# Patient Record
Sex: Female | Born: 1940
Health system: Southern US, Community
[De-identification: ages and names within clinical notes are randomized; demographics above are authoritative.]

## PROBLEM LIST (undated history)

## (undated) DIAGNOSIS — G40909 Epilepsy, unspecified, not intractable, without status epilepticus: Secondary | ICD-10-CM

## (undated) DIAGNOSIS — Z9889 Other specified postprocedural states: Secondary | ICD-10-CM

## (undated) DIAGNOSIS — K5790 Diverticulosis of intestine, part unspecified, without perforation or abscess without bleeding: Secondary | ICD-10-CM

## (undated) DIAGNOSIS — R011 Cardiac murmur, unspecified: Secondary | ICD-10-CM

## (undated) DIAGNOSIS — O99350 Diseases of the nervous system complicating pregnancy, unspecified trimester: Secondary | ICD-10-CM

## (undated) DIAGNOSIS — K8020 Calculus of gallbladder without cholecystitis without obstruction: Secondary | ICD-10-CM

## (undated) DIAGNOSIS — K219 Gastro-esophageal reflux disease without esophagitis: Secondary | ICD-10-CM

## (undated) DIAGNOSIS — R112 Nausea with vomiting, unspecified: Secondary | ICD-10-CM

## (undated) DIAGNOSIS — R7301 Impaired fasting glucose: Secondary | ICD-10-CM

## (undated) DIAGNOSIS — M8580 Other specified disorders of bone density and structure, unspecified site: Secondary | ICD-10-CM

## (undated) DIAGNOSIS — I10 Essential (primary) hypertension: Secondary | ICD-10-CM

## (undated) HISTORY — DX: Epilepsy, unspecified, not intractable, without status epilepticus: G40.909

## (undated) HISTORY — PX: CATARACT EXTRACTION, BILATERAL: SHX1313

## (undated) HISTORY — DX: Diseases of the nervous system complicating pregnancy, unspecified trimester: O99.350

## (undated) HISTORY — DX: Calculus of gallbladder without cholecystitis without obstruction: K80.20

## (undated) HISTORY — PX: FOOT SURGERY: SHX648

## (undated) HISTORY — DX: Essential (primary) hypertension: I10

## (undated) HISTORY — DX: Other specified disorders of bone density and structure, unspecified site: M85.80

## (undated) HISTORY — DX: Impaired fasting glucose: R73.01

## (undated) HISTORY — DX: Cardiac murmur, unspecified: R01.1

## (undated) HISTORY — DX: Diverticulosis of intestine, part unspecified, without perforation or abscess without bleeding: K57.90

---

## 1960-05-07 HISTORY — PX: CHEST TUBE INSERTION: SHX231

## 1998-01-20 ENCOUNTER — Other Ambulatory Visit: Admission: RE | Admit: 1998-01-20 | Discharge: 1998-01-20 | Payer: Self-pay | Admitting: Gynecology

## 1998-09-01 ENCOUNTER — Ambulatory Visit (HOSPITAL_BASED_OUTPATIENT_CLINIC_OR_DEPARTMENT_OTHER): Admission: RE | Admit: 1998-09-01 | Discharge: 1998-09-01 | Payer: Self-pay | Admitting: Orthopedic Surgery

## 1999-05-08 HISTORY — PX: COLONOSCOPY: SHX174

## 1999-06-22 ENCOUNTER — Encounter: Payer: Self-pay | Admitting: Gynecology

## 1999-06-22 ENCOUNTER — Encounter: Admission: RE | Admit: 1999-06-22 | Discharge: 1999-06-22 | Payer: Self-pay | Admitting: Gynecology

## 2000-07-04 ENCOUNTER — Other Ambulatory Visit: Admission: RE | Admit: 2000-07-04 | Discharge: 2000-07-04 | Payer: Self-pay | Admitting: Gynecology

## 2001-08-07 ENCOUNTER — Other Ambulatory Visit: Admission: RE | Admit: 2001-08-07 | Discharge: 2001-08-07 | Payer: Self-pay | Admitting: Gynecology

## 2002-05-07 DIAGNOSIS — M8580 Other specified disorders of bone density and structure, unspecified site: Secondary | ICD-10-CM

## 2002-05-07 HISTORY — DX: Other specified disorders of bone density and structure, unspecified site: M85.80

## 2002-06-26 ENCOUNTER — Encounter: Payer: Self-pay | Admitting: Gynecology

## 2002-06-26 ENCOUNTER — Encounter: Admission: RE | Admit: 2002-06-26 | Discharge: 2002-06-26 | Payer: Self-pay | Admitting: Gynecology

## 2002-09-30 ENCOUNTER — Other Ambulatory Visit: Admission: RE | Admit: 2002-09-30 | Discharge: 2002-09-30 | Payer: Self-pay | Admitting: Gynecology

## 2003-08-28 ENCOUNTER — Emergency Department (HOSPITAL_COMMUNITY): Admission: EM | Admit: 2003-08-28 | Discharge: 2003-08-28 | Payer: Self-pay | Admitting: Emergency Medicine

## 2004-05-07 HISTORY — PX: COLONOSCOPY: SHX174

## 2004-06-19 ENCOUNTER — Ambulatory Visit: Payer: Self-pay | Admitting: Internal Medicine

## 2004-06-26 ENCOUNTER — Other Ambulatory Visit: Admission: RE | Admit: 2004-06-26 | Discharge: 2004-06-26 | Payer: Self-pay | Admitting: Gynecology

## 2004-08-25 ENCOUNTER — Ambulatory Visit: Payer: Self-pay | Admitting: Internal Medicine

## 2004-09-08 ENCOUNTER — Ambulatory Visit: Payer: Self-pay | Admitting: Internal Medicine

## 2004-12-07 ENCOUNTER — Ambulatory Visit: Payer: Self-pay | Admitting: Internal Medicine

## 2005-01-29 ENCOUNTER — Ambulatory Visit: Payer: Self-pay | Admitting: Internal Medicine

## 2005-07-18 ENCOUNTER — Other Ambulatory Visit: Admission: RE | Admit: 2005-07-18 | Discharge: 2005-07-18 | Payer: Self-pay | Admitting: Gynecology

## 2006-08-08 ENCOUNTER — Ambulatory Visit: Payer: Self-pay | Admitting: Internal Medicine

## 2006-08-09 ENCOUNTER — Ambulatory Visit: Payer: Self-pay | Admitting: Internal Medicine

## 2006-08-12 ENCOUNTER — Encounter: Payer: Self-pay | Admitting: Internal Medicine

## 2006-08-15 ENCOUNTER — Ambulatory Visit: Payer: Self-pay | Admitting: Internal Medicine

## 2006-08-15 LAB — CONVERTED CEMR LAB
Creatinine,U: 75.7 mg/dL
Hgb A1c MFr Bld: 5.6 % (ref 4.6–6.0)
Microalb Creat Ratio: 17.2 mg/g (ref 0.0–30.0)
Microalb, Ur: 1.3 mg/dL (ref 0.0–1.9)

## 2006-08-20 ENCOUNTER — Encounter: Payer: Self-pay | Admitting: Internal Medicine

## 2006-09-04 DIAGNOSIS — M899 Disorder of bone, unspecified: Secondary | ICD-10-CM | POA: Insufficient documentation

## 2006-09-04 DIAGNOSIS — I10 Essential (primary) hypertension: Secondary | ICD-10-CM | POA: Insufficient documentation

## 2006-09-04 DIAGNOSIS — M949 Disorder of cartilage, unspecified: Secondary | ICD-10-CM

## 2006-09-05 ENCOUNTER — Ambulatory Visit: Payer: Self-pay | Admitting: Internal Medicine

## 2007-08-14 ENCOUNTER — Encounter: Payer: Self-pay | Admitting: Internal Medicine

## 2007-08-21 ENCOUNTER — Ambulatory Visit: Payer: Self-pay | Admitting: Internal Medicine

## 2007-08-21 DIAGNOSIS — R7989 Other specified abnormal findings of blood chemistry: Secondary | ICD-10-CM | POA: Insufficient documentation

## 2007-08-21 DIAGNOSIS — E785 Hyperlipidemia, unspecified: Secondary | ICD-10-CM | POA: Insufficient documentation

## 2007-08-21 LAB — CONVERTED CEMR LAB
Cholesterol, target level: 200 mg/dL
HDL goal, serum: 40 mg/dL
LDL Goal: 130 mg/dL

## 2008-04-02 ENCOUNTER — Ambulatory Visit: Payer: Self-pay | Admitting: Internal Medicine

## 2008-04-02 DIAGNOSIS — M5412 Radiculopathy, cervical region: Secondary | ICD-10-CM | POA: Insufficient documentation

## 2008-04-27 ENCOUNTER — Telehealth: Payer: Self-pay | Admitting: Internal Medicine

## 2009-05-07 DIAGNOSIS — R7301 Impaired fasting glucose: Secondary | ICD-10-CM

## 2009-05-07 HISTORY — DX: Impaired fasting glucose: R73.01

## 2009-06-08 ENCOUNTER — Telehealth (INDEPENDENT_AMBULATORY_CARE_PROVIDER_SITE_OTHER): Payer: Self-pay | Admitting: *Deleted

## 2009-08-19 ENCOUNTER — Ambulatory Visit: Payer: Self-pay | Admitting: Internal Medicine

## 2009-08-19 DIAGNOSIS — K5730 Diverticulosis of large intestine without perforation or abscess without bleeding: Secondary | ICD-10-CM | POA: Insufficient documentation

## 2009-08-19 DIAGNOSIS — IMO0002 Reserved for concepts with insufficient information to code with codable children: Secondary | ICD-10-CM | POA: Insufficient documentation

## 2010-05-28 ENCOUNTER — Encounter: Payer: Self-pay | Admitting: Internal Medicine

## 2010-06-04 LAB — CONVERTED CEMR LAB
ALT: 16 units/L (ref 0–35)
ALT: 18 units/L (ref 0–40)
AST: 19 units/L (ref 0–37)
AST: 20 units/L (ref 0–37)
Albumin: 4.2 g/dL (ref 3.5–5.2)
Alkaline Phosphatase: 63 units/L (ref 39–117)
BUN: 14 mg/dL (ref 6–23)
BUN: 17 mg/dL (ref 6–23)
Basophils Absolute: 0 10*3/uL (ref 0.0–0.1)
Basophils Relative: 0.3 % (ref 0.0–3.0)
Bilirubin, Direct: 0.1 mg/dL (ref 0.0–0.3)
CO2: 28 meq/L (ref 19–32)
Calcium: 9.3 mg/dL (ref 8.4–10.5)
Chloride: 101 meq/L (ref 96–112)
Cholesterol: 204 mg/dL — ABNORMAL HIGH (ref 0–200)
Creatinine, Ser: 0.8 mg/dL (ref 0.4–1.2)
Creatinine, Ser: 0.8 mg/dL (ref 0.4–1.2)
Direct LDL: 133 mg/dL
Eosinophils Absolute: 0 10*3/uL (ref 0.0–0.7)
Eosinophils Relative: 0.5 % (ref 0.0–5.0)
GFR calc non Af Amer: 75.32 mL/min (ref >60)
Glucose, Bld: 107 mg/dL — ABNORMAL HIGH (ref 70–99)
Glucose, Bld: 112 mg/dL — ABNORMAL HIGH (ref 70–99)
HCT: 43.9 % (ref 36.0–46.0)
HDL: 47.1 mg/dL (ref >39.00)
Hemoglobin: 14.9 g/dL (ref 12.0–15.0)
Hgb A1c MFr Bld: 6 % (ref 4.6–6.5)
Lymphocytes Relative: 18.1 % (ref 12.0–46.0)
Lymphs Abs: 1.6 10*3/uL (ref 0.7–4.0)
MCHC: 33.9 g/dL (ref 30.0–36.0)
MCV: 87.7 fL (ref 78.0–100.0)
Monocytes Absolute: 0.6 10*3/uL (ref 0.1–1.0)
Monocytes Relative: 7.1 % (ref 3.0–12.0)
Neutro Abs: 6.4 10*3/uL (ref 1.4–7.7)
Neutrophils Relative %: 74 % (ref 43.0–77.0)
Platelets: 306 10*3/uL (ref 150.0–400.0)
Potassium: 4.1 meq/L (ref 3.5–5.1)
Potassium: 4.3 meq/L (ref 3.5–5.1)
RBC: 5.01 M/uL (ref 3.87–5.11)
RDW: 13.7 % (ref 11.5–14.6)
Sodium: 139 meq/L (ref 135–145)
TSH: 1.41 microintl units/mL (ref 0.35–5.50)
Total Bilirubin: 0.6 mg/dL (ref 0.3–1.2)
Total CHOL/HDL Ratio: 4
Total Protein: 7.1 g/dL (ref 6.0–8.3)
Triglycerides: 144 mg/dL (ref 0.0–149.0)
VLDL: 28.8 mg/dL (ref 0.0–40.0)
Vit D, 25-Hydroxy: 49 ng/mL (ref 30–89)
WBC: 8.6 10*3/uL (ref 4.5–10.5)

## 2010-06-06 NOTE — Progress Notes (Signed)
Summary: Medication Concerns  Phone Note Call from Patient Call back at Home Phone [phone]   Caller: Patient Summary of Call: Message left on VM: Patient with RX concerns, please caa    Spoke with patient: Refill Lotrel, [facility], patient said she used [facility] in the past but will no longer be using them. Patient was informed her last OV 2009, she is due for an OV, patient said she will call back to schedule./[facility]  June 08, 2009 5:13 PM     Prescriptions: AMLODIPINE BESY-BENAZEPRIL HCL 5-20 MG  CAPS (AMLODIPINE BESY-BENAZEPRIL HCL) 1 by mouth once daily  #30 x 0   Entered by:   Shonna Chock   Authorized by:   Marga Melnick MD   Signed by:   Shonna Chock on 06/08/2009   Method used:   Electronically to        [facility] (retail)       [address]       [place], [place]  [address]       Ph: [phone]       Fax: [phone]   RxID:   [id number]

## 2010-06-06 NOTE — Assessment & Plan Note (Signed)
Summary: emp//pt will be fasting//lh   Vital Signs:  Patient profile:   70 year old female Height:      63.5 inches Weight:      121 pounds BMI:     21.17 Temp:     97.8 degrees F oral Pulse rate:   84 / minute Resp:     16 per minute BP sitting:   102 / 76  (left arm) Cuff size:   regular  Vitals Entered By: Shonna Chock (August 19, 2009 8:24 AM)  CC: 1.) Yearly follow-up and fasting labs 2.)Nerve pain, General Medical Evaluation Comments REVIEWED MED LIST, PATIENT AGREED DOSE AND INSTRUCTION CORRECT    CC:  1.) Yearly follow-up and fasting labs 2.)Nerve pain and General Medical Evaluation.  History of Present Illness: Teresa Morris is here for a physical; she is having a sciatic flare after yard work week of 04/04 & using [employer] @ gym. Preventive healthcare measures reviewed ;all up to date except no influenza immunization for years.  Preventive Screening-Counseling & Management  Alcohol-Tobacco     Smoking Status: quit  Caffeine-Diet-Exercise     Does Patient Exercise: yes  Allergies (verified): No Known Drug Allergies  Past History:  Past Medical History: Hyperlipidemia Hypertension Osteopenia,BMD @ Dr Vickey Sages' eclampsia/seizures with 1st pregnancy elevated lipids with normal C reactive protein &  homocysteine Diverticulosis, colon  Past Surgical History: collapsed lung due to  spontaneous pneumothorax 1962 two C-sections pneumonia as a child gravida  2 para 2 foot surgery  colonoscopy negative  2001 colonoscopy tics 2006 Cataract extraction,bilaterally  Family History: mother: bladder cancer, diabetes father: diabetes maternal grandmother: htn, MI  @ age 82 son: thyroid disease sister: thyroid disease daughter: thyroid disease  Social History: Low fat diet Retired Former Smoker: smoked 5 years total Alcohol use-no Regular exercise-yes: treadmill , stationary bike 3X /week Smoking Status:  quit Does Patient Exercise:  yes  Review of  Systems General:  Denies chills, fatigue, fever, sleep disorder, and sweats. Eyes:  Denies blurring, double vision, and vision loss-both eyes. ENT:  Denies difficulty swallowing and hoarseness. CV:  Denies chest pain or discomfort, leg cramps with exertion, palpitations, shortness of breath with exertion, swelling of feet, and swelling of hands. Resp:  Denies cough, shortness of breath, sputum productive, and wheezing. GI:  Denies abdominal pain, bloody stools, change in bowel habits, dark tarry stools, and indigestion. GU:  Denies discharge, dysuria, hematuria, and incontinence. MS:  Complains of low back pain; denies joint redness, joint swelling, loss of strength, and thoracic pain. Derm:  Denies changes in nail beds, dryness, hair loss, lesion(s), poor wound healing, and rash. Neuro:  Denies brief paralysis, numbness, and weakness; Tingling & sharp pain LLE laterally to knee in L3-4 area. Psych:  Denies anxiety and depression. Endo:  Denies cold intolerance, excessive hunger, excessive thirst, excessive urination, and heat intolerance. Heme:  Denies abnormal bruising and bleeding. Allergy:  Denies itching eyes and sneezing.  Physical Exam  General:  Appears younger than age; in some discomfort but in no acute distress; alert,appropriate and cooperative throughout examination Head:  Normocephalic and atraumatic without obvious abnormalities.  Eyes:  No corneal or conjunctival inflammation noted. No lid lag. Perrla. Funduscopic exam benign, without hemorrhages, exudates or papilledema.Slight ptosis OS. Ears:  External ear exam shows no significant lesions or deformities.  Otoscopic examination reveals clear canals, tympanic membranes are intact bilaterally without bulging, retraction, inflammation or discharge. Hearing is grossly normal bilaterally. Nose:  External nasal examination shows no deformity or  inflammation. Nasal mucosa are pink and moist without lesions or exudates. Mouth:  Oral  mucosa and oropharynx without lesions or exudates.  Teeth in good repair. Neck:  No deformities, masses, or tenderness noted.Minor thyroid asymmetry Lungs:  Normal respiratory effort, chest expands symmetrically. Lungs are clear to auscultation, no crackles or wheezes. Heart:  normal rate, regular rhythm, no gallop, no rub, no JVD, no HJR, and grade1/2-1  /6 systolic murmur.   Abdomen:  Bowel sounds positive,abdomen soft and non-tender without masses, organomegaly or hernias noted. Genitalia:  Dr. Vickey Sages, Gyn Msk:  No deformity or scoliosis noted of thoracic or lumbar spine.   Pulses:  R and L carotid,radial,dorsalis pedis and posterior tibial pulses are full and equal bilaterally Extremities:  No clubbing, cyanosis, edema. Pain L buttock with SLR maneuver. Crepitus L knee. Neurologic:  alert & oriented X3, strength normal in all extremities, heel /toe gait normal but painful subjectively,  DTRs symmetrical and normal.   Skin:  Intact without suspicious lesions or rashes Cervical Nodes:  No lymphadenopathy noted Axillary Nodes:  No palpable lymphadenopathy Psych:  memory intact for recent and remote, normally interactive, and good eye contact.     Impression & Recommendations:  Problem # 1:  PREVENTIVE HEALTH CARE (ICD-V70.0)  Orders: Venipuncture (18841) TLB-Lipid Panel (80061-LIPID) TLB-BMP (Basic Metabolic Panel-BMET) (80048-METABOL) TLB-CBC Platelet - w/Differential (85025-CBCD) TLB-Hepatic/Liver Function Pnl (80076-HEPATIC) TLB-TSH (Thyroid Stimulating Hormone) (84443-TSH) EKG w/ Interpretation (93000) T-Vitamin D (25-Hydroxy) (66063-01601)  Problem # 2:  LUMBAR RADICULOPATHY, LEFT (ICD-724.4) L3-4 radiation The following medications were removed from the medication list:    Tramadol Hcl 50 Mg Tabs (Tramadol hcl) .Marland Kitchen... 1 q 6 hrs prn Her updated medication list for this problem includes:    Cyclobenzaprine Hcl 5 Mg Tabs (Cyclobenzaprine hcl) .Marland Kitchen... 1 at bedtime prn     Cyclobenzaprine Hcl 5 Mg Tabs (Cyclobenzaprine hcl) .Marland Kitchen... 1-2 at bedtime as needed for back pain  Orders: Venipuncture (09323) EKG w/ Interpretation (93000)  Problem # 3:  HYPERLIPIDEMIA (ICD-272.4)  Orders: Venipuncture (55732) TLB-Lipid Panel (80061-LIPID) EKG w/ Interpretation (93000)  Problem # 4:  HYPERTENSION (ICD-401.9)  The following medications were removed from the medication list:    Amlodipine Besy-benazepril Hcl 5-20 Mg Caps (Amlodipine besy-benazepril hcl) .Marland Kitchen... 1 by mouth once daily Her updated medication list for this problem includes:    Amlodipine Besylate 5 Mg Tabs (Amlodipine besylate) .Marland Kitchen... 1 once daily    Benazepril Hcl 20 Mg Tabs (Benazepril hcl) .Marland Kitchen... 1 once daily  Orders: Venipuncture (20254) EKG w/ Interpretation (93000) Prescription Created Electronically [id number])  Problem # 5:  OSTEOPENIA (ICD-733.90)  Orders: Venipuncture (37628) EKG w/ Interpretation (93000) T-Vitamin D (25-Hydroxy) (31517-61607)  Complete Medication List: 1)  Multivitamins Tabs (Multiple vitamin) .Marland Kitchen.. 1 by mouth once daily 2)  Calcium 500 Mg Tabs (Calcium carbonate) .Marland Kitchen.. 1 by mouth once daily 3)  Cyclobenzaprine Hcl 5 Mg Tabs (Cyclobenzaprine hcl) .Marland Kitchen.. 1 at bedtime prn 4)  Amlodipine Besylate 5 Mg Tabs (Amlodipine besylate) .Marland Kitchen.. 1 once daily 5)  Benazepril Hcl 20 Mg Tabs (Benazepril hcl) .Marland Kitchen.. 1 once daily 6)  Gabapentin 100 Mg Caps (Gabapentin) .Marland Kitchen.. 1 every 8 hrs as needed for leg pain 7)  Cyclobenzaprine Hcl 5 Mg Tabs (Cyclobenzaprine hcl) .Marland Kitchen.. 1-2 at bedtime as needed for back pain  Patient Instructions: 1)  Share records with all MDs seen. Take samples of Celebrex 200 mg two times a day as needed (anti-inflammatory) Prescriptions: CYCLOBENZAPRINE HCL 5 MG TABS (CYCLOBENZAPRINE HCL) 1-2 at bedtime as needed for back  pain  #20 x 0   Entered and Authorized by:   Marga Melnick MD   Signed by:   Marga Melnick MD on 08/19/2009   Method used:   Faxed to ...       [facility] (retail)       [address]       [place], [place]  [address]       Ph: [phone]       Fax: [phone]   RxID:   [id number] GABAPENTIN 100 MG CAPS (GABAPENTIN) 1 every 8 hrs as needed for leg pain  #30 x 1   Entered and Authorized by:   Marga Melnick MD   Signed by:   Marga Melnick MD on 08/19/2009   Method used:   Faxed to ...       [facility] (retail)       [address]       [place], [place]  [address]       Ph: [phone]       Fax: [phone]   RxID:   [id number] BENAZEPRIL HCL 20 MG TABS (BENAZEPRIL HCL) 1 once daily  #90 x 3   Entered and Authorized by:   Marga Melnick MD   Signed by:   Marga Melnick MD on 08/19/2009   Method used:   Faxed to ...       [facility] (retail)       [address]       [place], [place]  [address]       Ph: [phone]       Fax: [phone]   RxID:   [id number] AMLODIPINE BESYLATE 5 MG TABS (AMLODIPINE BESYLATE) 1 once daily  #90 x 3   Entered and Authorized by:   Marga Melnick MD   Signed by:   Marga Melnick MD on 08/19/2009   Method used:   Faxed to ...       [facility] (retail)       [address]       [place], [place]  [address]       Ph: [phone]       Fax: [phone]   RxID:   [id number]

## 2010-08-24 ENCOUNTER — Other Ambulatory Visit: Payer: Self-pay | Admitting: Internal Medicine

## 2010-09-22 NOTE — Assessment & Plan Note (Signed)
[facility]                        [place] OFFICE NOTE   NAME:COVERTAlan, Riles                   MRN:          [id number]  DATE:08/09/2006                            DOB:          05-Jul-1940    Teresa Morris was seen August 09, 2006 for a comprehensive evaluation and  evaluation of risk factors.   She is essentially asymptomatic.   There is no changes in her past medical history.  She had a spontaneous  pneumothorax in 1962.  She  is gravida 2 para 2; two C-sections.  She  also had pneumonia as a child.  She has had outpatient foot surgery in  1999.  She has had colonoscopies in 2001 and 2006, diverticulosis was  found.   She did have eclampsia and seizures with a pregnancy.  Medical problems  include hypertension and dyslipidemia.  She also has osteopenia.   Her mother had bladder cancer and received radiation; she also was  diabetic.  Her father was diabetic.  In paternal grandmother had  hypertension and myocardial infarction late in life.  Thyroid disease is  found in her son, sister, and daughter.  She smoked less than a pack per  day for less than 3 years, she does not drink.  She exercises 2-3 times  per week with no cardiopulmonary symptoms.  She is restricting fat,  fried food.   She is on generic Lotrel 5/20.  SHE HAS NO KNOWN DRUG ALLERGIES.   REVIEW OF SYSTEMS:  Was completed en toto and was negative.   Specifically, in view of the family history, there was no history of  polyphagia, polydipsia, polyuria, paresthesias, nonhealing skin lesions.  She had an eye exam in February with no diabetic changes.   She is 5 foot 3 and weighs 128 pounds; this is up 3 pounds.  Pulse is 60  and irregular, respiratory rate 12, and blood pressure 120/82.  The fundal exam does reveal some arterial narrowing.  There is no lid  lag.  Otolaryngologic exam is unremarkable.  Dental hygiene is  excellent.  Thyroid is normal to palpation.  She  has no carotid bruits;  She does  have a grade 1 systolic murmur across the precordium.  CHEST:  Clear to auscultation.  She has no organomegaly or masses.  There was no aortic aneurysm.  Pedal pulses are intact, there is no edema.  NEUROPSYCHIATRIC:  Normal.  BREASTS, PELVIC, RECTAL:  Exams are deferred to Dr. Nicholas Lose, her  gynecologist.  She did have labs done prior to this visit.  Her glucose was mildly  elevated at 107.  In reviewing the chart she had a similar mild  hyperglycemia in 2006.  Serially, her LDL is in the area of 160.  I will  recommend to Teresa Chapel that she have an NMR fasting profile to determine her  cardiovascular risk and to assess genetic components to the  dyslipidemia.   Her blood pressure is well controlled and no change will be made in her  meds.  EKG was normal.  Her colonoscopic surveillance is up to date.  Dr. Nicholas Lose is performing bone densities and  these should be done every 25  months at his office; 1500 mg of calcium and at least 1000 mg of Vitamin  D would be recommended.   Labs from Dr. Johnn Hai office were reviewed; her LDL was 132 in 2007,  triglycerides were 171.  Her vitamin D level was low at 39.7.  Her last  bone density was November 01, 2004 and showed some progression of  osteopenia.  She would be due for repeat in August of 2008.  Dr. Nicholas Lose  apparently has recommended Boniva, but this was not initiated.     Titus Dubin. Alwyn Ren, MD,FACP,FCCP  Electronically Signed    [facility]  DD: 08/09/2006  DT: 08/09/2006  Job #: [id number]

## 2010-10-05 ENCOUNTER — Ambulatory Visit (INDEPENDENT_AMBULATORY_CARE_PROVIDER_SITE_OTHER): Payer: Medicare Other | Admitting: Internal Medicine

## 2010-10-05 ENCOUNTER — Encounter: Payer: Self-pay | Admitting: Internal Medicine

## 2010-10-05 VITALS — BP 112/68 | HR 72 | Temp 98.2°F | Resp 12 | Ht 62.25 in | Wt 116.6 lb

## 2010-10-05 DIAGNOSIS — K5730 Diverticulosis of large intestine without perforation or abscess without bleeding: Secondary | ICD-10-CM

## 2010-10-05 DIAGNOSIS — M949 Disorder of cartilage, unspecified: Secondary | ICD-10-CM

## 2010-10-05 DIAGNOSIS — M899 Disorder of bone, unspecified: Secondary | ICD-10-CM

## 2010-10-05 DIAGNOSIS — E785 Hyperlipidemia, unspecified: Secondary | ICD-10-CM

## 2010-10-05 DIAGNOSIS — M5412 Radiculopathy, cervical region: Secondary | ICD-10-CM

## 2010-10-05 DIAGNOSIS — I10 Essential (primary) hypertension: Secondary | ICD-10-CM

## 2010-10-05 DIAGNOSIS — Z0000 Encounter for general adult medical examination without abnormal findings: Secondary | ICD-10-CM

## 2010-10-05 MED ORDER — AMLODIPINE BESYLATE 5 MG PO TABS: 5.0000 mg | ORAL_TABLET | ORAL | Status: DC

## 2010-10-05 MED ORDER — BENAZEPRIL HCL 20 MG PO TABS: 20.0000 mg | ORAL_TABLET | ORAL | Status: DC

## 2010-10-05 NOTE — Progress Notes (Signed)
Subjective:    Patient ID: Teresa Morris, female    DOB: [birth year], 70 y.o.   MRN: [id number]  HPI Medicare Wellness Visit:  The following psychosocial & medical history were reviewed as required by Medicare.   Social history: caffeine: 2 cups coffee & 2 teas / day , alcohol:  none ,  tobacco use : quit 1962  & exercise : Cardio (treadmill, bike) 2-3 X/week.   Home & personal  safety / fall risk: no issues, activities of daily living: no  limitations , seatbelt use : yes , and smoke alarm employment : yes .  Power of Attorney/Living Will status :  In place  Vision ( as recorded per Nurse) & Hearing  evaluation :  Whisper heard @ 6 ft. Orientation :oriented X3 , memory & recall : normal, spelling  testing: normal,and mood & affect : normal . Depression / anxiety: denied Travel history : [place] , immunization status :Flu & Pneumovax not taken , transfusion history:  Post delivery by C section, and preventive health surveillance ( colonoscopies, BMD , etc as per protocol/ SOC): BMD due, Dental care:  Seen every 6 mos . Chart reviewed &  Updated. Active issues reviewed & addressed.        Review of Systems Patient reports no vision/ hearing  changes, adenopathy,fever, weight change,  persistant / recurrent hoarseness , swallowing issues, chest pain,palpitations,edema,persistant /recurrent cough, hemoptysis, dyspnea( rest/ exertional/paroxysmal nocturnal), gastrointestinal bleeding(melena, rectal bleeding), abdominal pain, significant heartburn,  bowel changes,GU symptoms(dysuria, hematuria,pyuria, incontinence) ), Gyn symptoms(abnormal  bleeding , pain),  syncope, focal weakness, memory loss,numbness & tingling, skin/hair /nail changes,abnormal bruising or bleeding, anxiety,or depression.      Objective:   Physical Exam Gen.: Thin but healthy and well-nourished in appearance. Alert, appropriate and cooperative throughout exam. Head: Normocephalic without obvious abnormalities. Eyes:  No corneal or conjunctival inflammation noted. Pupils equal round reactive to light and accommodation. Fundal exam is benign without hemorrhages, exudate, papilledema. Extraocular motion intact.  Ears: External  ear exam reveals no significant lesions or deformities. Canals clear .TMs normal. Hearing is grossly normal bilaterally. Nose: External nasal exam reveals no deformity or inflammation. Nasal mucosa are pink and moist. No lesions or exudates noted. Septum   normal  Mouth: Oral mucosa and oropharynx reveal no lesions or exudates. Teeth in good repair. Neck: No deformities, masses, or tenderness noted. Range of motion & . Thyroid normal. Lungs: Normal respiratory effort; chest expands symmetrically. Lungs are clear to auscultation without rales, wheezes, or increased work of breathing. Heart: Normal rate and rhythm. Normal S1 and S2. No gallop, click, or rub. No  murmur. Abdomen: Bowel sounds normal; abdomen soft and nontender. No masses, organomegaly or hernias noted. Genitalia: Dr Vickey Sages, [facility] , due 01/2011.                                                                                                                            Musculoskeletal/extremities: No deformity or scoliosis  noted of  the thoracic or lumbar spine. No clubbing, cyanosis, edema, or deformity noted. Range of motion  normal .Tone & strength  normal.Joints normal. Nail health  good. Vascular: Carotid, radial artery, dorsalis pedis and dorsalis posterior tibial pulses are full and equal. No bruits present. Neurologic: Alert and oriented x3. Deep tendon reflexes symmetrical and normal.                                                                                      Skin: Intact without suspicious lesions or rashes. Lymph: No cervical, axillary, or inguinal lymphadenopathy present. Psych: Mood and affect are normal. Normally interactive                                                                                          Assessment & Plan:   #1Medicare wellness visit; criteria met & data enter  #2 hypertension, controlled  #3 dyslipidemia, LDL goal is less than 100 based on advanced cholesterol testing. No family history of premature coronary disease/MI  #4 osteopenia, bone mineral density as per gynecology. Vitamin D level recheck her fasting labs.

## 2010-10-05 NOTE — Patient Instructions (Signed)
Preventive Health Care: Exercise  30-45  minutes a day, 3-4 days a week. Walking is especially valuable in preventing Osteoporosis. Eat a low-fat diet with lots of fruits and vegetables, up to 7-9 servings per day. Avoid obesity; your goal = waist less than 35 inches.Consume less than 30 grams of sugar per day from foods & drinks with High Fructose Corn Syrup as #2,3 or #4 on label. Please schedule fasting labs: BMET, fasting lipids, CBC and differential, TSH, hepatic panel and vitamin D level( 401.9, 272.4, 733.90)

## 2010-10-05 NOTE — Assessment & Plan Note (Signed)
Resolved w/o surgery

## 2010-10-08 ENCOUNTER — Encounter: Payer: Self-pay | Admitting: Internal Medicine

## 2010-10-08 NOTE — Progress Notes (Signed)
  Subjective:    Patient ID: Teresa Morris, female    DOB: 07/03/1940, 70 y.o.   MRN: [id number]  HPI    Review of Systems     Objective:   Physical Exam        Assessment & Plan:

## 2010-10-10 ENCOUNTER — Other Ambulatory Visit: Payer: Self-pay | Admitting: *Deleted

## 2010-10-10 DIAGNOSIS — M949 Disorder of cartilage, unspecified: Secondary | ICD-10-CM

## 2010-10-10 DIAGNOSIS — M899 Disorder of bone, unspecified: Secondary | ICD-10-CM

## 2010-10-10 DIAGNOSIS — I10 Essential (primary) hypertension: Secondary | ICD-10-CM

## 2010-10-10 DIAGNOSIS — E785 Hyperlipidemia, unspecified: Secondary | ICD-10-CM

## 2010-10-11 ENCOUNTER — Other Ambulatory Visit (INDEPENDENT_AMBULATORY_CARE_PROVIDER_SITE_OTHER): Payer: Medicare Other

## 2010-10-11 DIAGNOSIS — I10 Essential (primary) hypertension: Secondary | ICD-10-CM

## 2010-10-11 DIAGNOSIS — M949 Disorder of cartilage, unspecified: Secondary | ICD-10-CM

## 2010-10-11 DIAGNOSIS — M899 Disorder of bone, unspecified: Secondary | ICD-10-CM

## 2010-10-11 DIAGNOSIS — E785 Hyperlipidemia, unspecified: Secondary | ICD-10-CM

## 2010-10-11 LAB — CBC WITH DIFFERENTIAL/PLATELET
Basophils Absolute: 0 10*3/uL (ref 0.0–0.1)
Eosinophils Absolute: 0.1 10*3/uL (ref 0.0–0.7)
Lymphocytes Relative: 24.9 % (ref 12.0–46.0)
MCHC: 33.6 g/dL (ref 30.0–36.0)
Monocytes Relative: 7.8 % (ref 3.0–12.0)
Neutrophils Relative %: 66 % (ref 43.0–77.0)
RBC: 5 Mil/uL (ref 3.87–5.11)
RDW: 13.9 % (ref 11.5–14.6)

## 2010-10-11 LAB — HEPATIC FUNCTION PANEL
ALT: 16 U/L (ref 0–35)
AST: 20 U/L (ref 0–37)
Albumin: 4.8 g/dL (ref 3.5–5.2)
Alkaline Phosphatase: 65 U/L (ref 39–117)
Bilirubin, Direct: 0.1 mg/dL (ref 0.0–0.3)
Total Bilirubin: 0.7 mg/dL (ref 0.3–1.2)
Total Protein: 7.4 g/dL (ref 6.0–8.3)

## 2010-10-11 LAB — LIPID PANEL
Cholesterol: 230 mg/dL — ABNORMAL HIGH (ref 0–200)
HDL: 53.7 mg/dL (ref >39.00)
Total CHOL/HDL Ratio: 4
VLDL: 21.4 mg/dL (ref 0.0–40.0)

## 2010-10-11 LAB — VITAMIN D 25 HYDROXY (VIT D DEFICIENCY, FRACTURES): Vit D, 25-Hydroxy: 50 ng/mL (ref 30–89)

## 2010-10-11 LAB — BASIC METABOLIC PANEL
GFR: 70 mL/min (ref >60.00)
Glucose, Bld: 104 mg/dL — ABNORMAL HIGH (ref 70–99)
Potassium: 5.4 mEq/L — ABNORMAL HIGH (ref 3.5–5.1)
Sodium: 140 mEq/L (ref 135–145)

## 2010-10-11 NOTE — Progress Notes (Signed)
Labs only

## 2010-11-16 ENCOUNTER — Encounter: Payer: Self-pay | Admitting: Internal Medicine

## 2011-02-19 ENCOUNTER — Ambulatory Visit: Payer: Medicare Other | Admitting: Internal Medicine

## 2011-10-29 ENCOUNTER — Telehealth: Payer: Self-pay | Admitting: Internal Medicine

## 2011-10-29 DIAGNOSIS — I10 Essential (primary) hypertension: Secondary | ICD-10-CM

## 2011-10-29 MED ORDER — AMLODIPINE BESYLATE 5 MG PO TABS: 5.0000 mg | ORAL_TABLET | Freq: Every day | ORAL | Status: DC

## 2011-10-29 MED ORDER — BENAZEPRIL HCL 20 MG PO TABS: 20.0000 mg | ORAL_TABLET | Freq: Every day | ORAL | Status: DC

## 2011-10-29 NOTE — Telephone Encounter (Signed)
Rx sent 

## 2011-10-29 NOTE — Telephone Encounter (Signed)
The pt scheduled her cpe for late august.  She is hoping to get her blood pressure medicine refilled to last until then. She is hoping to have it sent to the [facility] on [address].  Thanks!

## 2011-12-04 ENCOUNTER — Encounter: Payer: Self-pay | Admitting: Internal Medicine

## 2011-12-24 ENCOUNTER — Ambulatory Visit (INDEPENDENT_AMBULATORY_CARE_PROVIDER_SITE_OTHER): Payer: Medicare Other | Admitting: Internal Medicine

## 2011-12-24 ENCOUNTER — Encounter: Payer: Self-pay | Admitting: Internal Medicine

## 2011-12-24 VITALS — BP 114/70 | HR 79 | Temp 97.9°F | Resp 12 | Ht 62.5 in | Wt 121.8 lb

## 2011-12-24 DIAGNOSIS — E785 Hyperlipidemia, unspecified: Secondary | ICD-10-CM

## 2011-12-24 DIAGNOSIS — K5730 Diverticulosis of large intestine without perforation or abscess without bleeding: Secondary | ICD-10-CM

## 2011-12-24 DIAGNOSIS — M899 Disorder of bone, unspecified: Secondary | ICD-10-CM

## 2011-12-24 DIAGNOSIS — M949 Disorder of cartilage, unspecified: Secondary | ICD-10-CM

## 2011-12-24 DIAGNOSIS — I10 Essential (primary) hypertension: Secondary | ICD-10-CM

## 2011-12-24 DIAGNOSIS — Z0000 Encounter for general adult medical examination without abnormal findings: Secondary | ICD-10-CM

## 2011-12-24 NOTE — Patient Instructions (Addendum)
Preventive Health Care: Exercise  30-45  minutes a day, 3-4 days a week. Walking is especially valuable in preventing Osteoporosis. Eat a low-fat diet with lots of fruits and vegetables, up to 7-9 servings per day.  Consume less than 30 grams of sugar per day from foods & drinks with High Fructose Corn Syrup as # 1,2,3 or #4 on label. Please  schedule fasting Labs : BMET,Lipids, hepatic panel, CBC & dif, TSH,vitamin D level.PLEASE BRING THESE INSTRUCTIONS TO FOLLOW UP  LAB APPOINTMENT.This will guarantee correct labs are drawn, eliminating need for repeat blood sampling ( needle sticks ! ). Diagnoses /Codes:733.90,401.9,272.4,562.10  If you activate My Chart; the results can be released to you as soon as they populate from the lab. If you choose not to use this program; the labs have to be reviewed, copied & mailed causing a delay in getting the results to you.

## 2011-12-24 NOTE — Progress Notes (Signed)
Subjective:    Patient ID: Teresa Morris, female    DOB: 1940-10-13, 71 y.o.   MRN: [id number]  HPI Medicare Wellness Visit:  The following psychosocial & medical history were reviewed as required by Medicare.   Social history: caffeine: 2 cups / day , alcohol:  no ,  tobacco use : quit 1962  & exercise : usually 2-3 X/ week with machines & treadmilll.   Home & personal  safety / fall risk: no issues, activities of daily living: no limitations , seatbelt use : yes , and smoke alarm employment : yes .  Power of Attorney/Living Will status : in place  Vision ( as recorded per Nurse) & Hearing  evaluation :  Ophth exam this year ; no hearing evaluation. Orientation :oriented x 3 , memory & recall :good,  math testing: good,and mood & affect : normal . Depression / anxiety: denied Travel history : [place] , immunization status :PNA needed , transfusion history: with pneumonia as child, and preventive health surveillance ( colonoscopies, BMD , etc as per protocol/ [facility]): colonoscopy up to date, Dental care:  Every 6 mos . Chart reviewed &  Updated. Active issues reviewed & addressed.       Review of Systems HYPERTENSION: Disease Monitoring: Blood pressure average - 120/70  hest pain, palpitations- no       Dyspnea- no Medications: Compliance- yes  Lightheadedness,Syncope- no   Edema- no  FASTING HYPERGLYCEMIA, PMH of:  Polyuria/phagia/dipsia- no      Visual problems- no  HYPERLIPIDEMIA: Disease Monitoring: See symptoms for Hypertension Medications: Compliance- diet improved since 2012  Abd pain, bowel changes- no  Muscle aches- no         Objective:   Physical Exam Gen.: Thin but healthy and well-nourished in appearance. Alert, appropriate and cooperative throughout exam.Appears younger than stated age  Head: Normocephalic without obvious abnormalities  Eyes: No corneal or conjunctival inflammation noted. Pupils equal round reactive to light and accommodation.  Vision grossly normal. Ears: External  ear exam reveals no significant lesions or deformities. Canals clear .TMs normal. Hearing is grossly normal bilaterally. Nose: External nasal exam reveals no deformity or inflammation. Nasal mucosa are pink and moist. No lesions or exudates noted.   Mouth: Oral mucosa and oropharynx reveal no lesions or exudates. Teeth in good repair. Neck: No deformities, masses, or tenderness noted. Range of motion & Thyroid normal Lungs: Normal respiratory effort; chest expands symmetrically. Lungs are clear to auscultation without rales, wheezes, or increased work of breathing. Heart: Normal rate and rhythm. Normal S1 and S2. No gallop, click, or rub. Grade 1.5 /6 systolic murmur  @ base Abdomen: Bowel sounds normal; abdomen soft and nontender. No masses, organomegaly or hernias noted. Genitalia: Dr Vickey Sages                                                     Musculoskeletal/extremities: No significant deformity or scoliosis noted of  the thoracic or lumbar spine. No clubbing, cyanosis, edema, or deformity noted. Range of motion  normal .Tone & strength  normal.Joints normal. Nail health  good. Vascular: Carotid, radial artery, dorsalis pedis and  posterior tibial pulses are full and equal. No bruits present. Neurologic: Alert and oriented x3. Deep tendon reflexes symmetrical and normal.          Skin: Intact  without suspicious lesions or rashes. Lymph: No cervical, axillary lymphadenopathy present. Psych: Mood and affect are normal. Normally interactive                                                                                         Assessment & Plan:  #1 Medicare Wellness Exam; criteria met ; data entered #2 Problem List reviewed ; Assessment/ Recommendations made Plan: see Orders

## 2012-01-10 ENCOUNTER — Other Ambulatory Visit (INDEPENDENT_AMBULATORY_CARE_PROVIDER_SITE_OTHER): Payer: Medicare Other

## 2012-01-10 DIAGNOSIS — I10 Essential (primary) hypertension: Secondary | ICD-10-CM

## 2012-01-10 DIAGNOSIS — E785 Hyperlipidemia, unspecified: Secondary | ICD-10-CM

## 2012-01-10 DIAGNOSIS — Z0000 Encounter for general adult medical examination without abnormal findings: Secondary | ICD-10-CM

## 2012-01-10 LAB — LIPID PANEL
Cholesterol: 180 mg/dL (ref 0–200)
Triglycerides: 106 mg/dL (ref 0.0–149.0)

## 2012-01-10 LAB — CBC WITH DIFFERENTIAL/PLATELET
Basophils Absolute: 0 10*3/uL (ref 0.0–0.1)
Eosinophils Absolute: 0.1 10*3/uL (ref 0.0–0.7)
Hemoglobin: 13.7 g/dL (ref 12.0–15.0)
Lymphocytes Relative: 26.4 % (ref 12.0–46.0)
MCHC: 32.9 g/dL (ref 30.0–36.0)
Neutro Abs: 3.7 10*3/uL (ref 1.4–7.7)
Neutrophils Relative %: 61.9 % (ref 43.0–77.0)
Platelets: 266 10*3/uL (ref 150.0–400.0)
RDW: 13.9 % (ref 11.5–14.6)

## 2012-01-10 LAB — HEPATIC FUNCTION PANEL
ALT: 14 U/L (ref 0–35)
AST: 19 U/L (ref 0–37)
Alkaline Phosphatase: 49 U/L (ref 39–117)
Bilirubin, Direct: 0.1 mg/dL (ref 0.0–0.3)
Total Protein: 6.6 g/dL (ref 6.0–8.3)

## 2012-01-10 LAB — TSH: TSH: 1.45 u[IU]/mL (ref 0.35–5.50)

## 2012-01-10 LAB — BASIC METABOLIC PANEL
BUN: 17 mg/dL (ref 6–23)
Creatinine, Ser: 0.8 mg/dL (ref 0.4–1.2)
GFR: 79.37 mL/min (ref >60.00)

## 2012-01-15 ENCOUNTER — Telehealth: Payer: Self-pay | Admitting: Internal Medicine

## 2012-01-15 NOTE — Telephone Encounter (Signed)
Patient was informed labs basically normal away from LDL @ 110 Range 0-99. Labs will be mailed once reviewed by MD. Patient stated is should be active with Mychart, In informed her that her MyChart status states pending, I gave patient Number ([phone]) to contact MyChart Help link.  Dr.Hopper please advise on patient's labs

## 2012-01-15 NOTE — Telephone Encounter (Signed)
please call with lab results or send to [facility] call after 2pm [phone]---lm on triage line 858am

## 2012-02-14 ENCOUNTER — Ambulatory Visit (INDEPENDENT_AMBULATORY_CARE_PROVIDER_SITE_OTHER): Payer: Medicare Other

## 2012-02-14 DIAGNOSIS — Z23 Encounter for immunization: Secondary | ICD-10-CM

## 2012-02-29 ENCOUNTER — Other Ambulatory Visit: Payer: Self-pay

## 2012-02-29 DIAGNOSIS — I10 Essential (primary) hypertension: Secondary | ICD-10-CM

## 2012-02-29 MED ORDER — BENAZEPRIL HCL 20 MG PO TABS: 20.0000 mg | ORAL_TABLET | Freq: Every day | ORAL | Status: DC

## 2012-02-29 MED ORDER — AMLODIPINE BESYLATE 5 MG PO TABS: 5.0000 mg | ORAL_TABLET | Freq: Every day | ORAL | Status: DC

## 2012-02-29 NOTE — Telephone Encounter (Signed)
Rx sent pt aware.   MW  

## 2012-06-02 ENCOUNTER — Other Ambulatory Visit: Payer: Self-pay | Admitting: Internal Medicine

## 2012-11-27 ENCOUNTER — Other Ambulatory Visit: Payer: Self-pay | Admitting: Internal Medicine

## 2012-12-05 ENCOUNTER — Encounter: Payer: Self-pay | Admitting: Internal Medicine

## 2013-01-12 ENCOUNTER — Encounter: Payer: Self-pay | Admitting: Internal Medicine

## 2013-01-12 ENCOUNTER — Ambulatory Visit (INDEPENDENT_AMBULATORY_CARE_PROVIDER_SITE_OTHER): Payer: Medicare Other | Admitting: Internal Medicine

## 2013-01-12 VITALS — BP 137/66 | HR 68 | Temp 98.4°F | Ht 62.75 in | Wt 116.0 lb

## 2013-01-12 DIAGNOSIS — M899 Disorder of bone, unspecified: Secondary | ICD-10-CM

## 2013-01-12 DIAGNOSIS — E785 Hyperlipidemia, unspecified: Secondary | ICD-10-CM

## 2013-01-12 DIAGNOSIS — K5730 Diverticulosis of large intestine without perforation or abscess without bleeding: Secondary | ICD-10-CM

## 2013-01-12 DIAGNOSIS — Z0000 Encounter for general adult medical examination without abnormal findings: Secondary | ICD-10-CM

## 2013-01-12 DIAGNOSIS — I10 Essential (primary) hypertension: Secondary | ICD-10-CM

## 2013-01-12 LAB — BASIC METABOLIC PANEL
BUN: 12 mg/dL (ref 6–23)
CO2: 29 mEq/L (ref 19–32)
Glucose, Bld: 93 mg/dL (ref 70–99)
Potassium: 4.2 mEq/L (ref 3.5–5.1)
Sodium: 140 mEq/L (ref 135–145)

## 2013-01-12 LAB — CBC WITH DIFFERENTIAL/PLATELET
Eosinophils Relative: 1.3 % (ref 0.0–5.0)
HCT: 45 % (ref 36.0–46.0)
Hemoglobin: 14.8 g/dL (ref 12.0–15.0)
Lymphs Abs: 2 10*3/uL (ref 0.7–4.0)
Monocytes Relative: 7.3 % (ref 3.0–12.0)
Neutro Abs: 6.1 10*3/uL (ref 1.4–7.7)
Platelets: 273 10*3/uL (ref 150.0–400.0)
WBC: 8.9 10*3/uL (ref 4.5–10.5)

## 2013-01-12 LAB — HEPATIC FUNCTION PANEL
ALT: 17 U/L (ref 0–35)
AST: 17 U/L (ref 0–37)
Total Bilirubin: 0.6 mg/dL (ref 0.3–1.2)

## 2013-01-12 LAB — LIPID PANEL
Cholesterol: 211 mg/dL — ABNORMAL HIGH (ref 0–200)
Total CHOL/HDL Ratio: 4

## 2013-01-12 MED ORDER — BENAZEPRIL HCL 20 MG PO TABS: ORAL_TABLET | ORAL | Status: DC

## 2013-01-12 MED ORDER — AMLODIPINE BESYLATE 5 MG PO TABS: ORAL_TABLET | ORAL | Status: DC

## 2013-01-12 NOTE — Progress Notes (Signed)
Subjective:    Patient ID: Teresa Morris, female    DOB: [birth year], 71 y.o.   MRN: [id number]  HPI Medicare Wellness Visit:  Psychosocial & medical history were reviewed as required by Medicare (abuse,antisocial behavioral risks,firearm risk).  Social history: caffeine: 2 cups coffee / day   & 2 teas/ day, alcohol:  no ,  tobacco use:  Quit 1962  Exercise :  See below Home & personal  safety / fall risk:no Limitations of activities of daily living:no Seatbelt  and smoke alarm use:yes Power of Attorney/Living Will status : in place Ophthalmology exam status :current Hearing evaluation status:not current Orientation :oriented X 3 Memory & recall :good Math testing: Active depression / anxiety:denied Foreign travel history : [place] 1998 Immunization status for Shingles /Flu/ PNA/ tetanus :flu needed Transfusion history:  ? With 1st C section 1958 Preventive health surveillance status of colonoscopy, BMD , mammograms,PAP as per protocol/ SOC: current Dental care:  Seen every 6 mos Chart reviewed &  Updated. Active issues reviewed & addressed.      Review of Systems She is on a heart healthy diet; she exercises as walking 2 miles 5 times per week without symptoms. Specifically she denies chest pain, palpitations, dyspnea, or claudication.  Family history is negative for premature coronary disease.  Advanced cholesterol testing reveals her LDL goal is less than 100. There is aversion to statin medication . BP @ home averages 135/70.     Objective:   Physical Exam Gen.: Thin but healthy and well-nourished in appearance. Alert, appropriate and cooperative throughout exam.Appears younger than stated age  Head: Normocephalic without obvious abnormalities Eyes: No corneal or conjunctival inflammation noted. Pupils equal round reactive to light and accommodation.  Extraocular motion intact. Vision grossly normal with intraocular lenses Ears: External  ear exam reveals no  significant lesions or deformities. Canals clear .TMs normal. Hearing is grossly normal bilaterally. Nose: External nasal exam reveals no deformity or inflammation. Nasal mucosa are pink and moist. No lesions or exudates noted.   Mouth: Oral mucosa and oropharynx reveal no lesions or exudates. Teeth in good repair. Neck: No deformities, masses, or tenderness noted. Range of motion & Thyroid normal. ? Cervical rib Lungs: Normal respiratory effort; chest expands symmetrically. Lungs are clear to auscultation without rales, wheezes, or increased work of breathing. Heart: Normal rate and rhythm. Normal S1 and S2. No gallop, click, or rub. Grade 1/6 systolic murmur. Abdomen: Bowel sounds normal; abdomen soft and nontender. No masses, organomegaly or hernias noted. Genitalia: As per Gyn                                  Musculoskeletal/extremities: Accentuated curvature of upper thoracic spine. No clubbing, cyanosis, edema, or significant extremity  deformity noted. Range of motion normal .Tone & strength  Normal. Joints normal . Nail health good. Able to lie down & sit up w/o help. Negative SLR bilaterally Vascular: Carotid, radial artery, dorsalis pedis and  posterior tibial pulses are full and equal. No bruits present. Neurologic: Alert and oriented x3. Deep tendon reflexes symmetrical and normal.         Skin: Intact without suspicious lesions or rashes. Lymph: No cervical, axillary lymphadenopathy present. Psych: Mood and affect are normal. Normally interactive  Assessment & Plan:  #1 Medicare Wellness Exam; criteria met ; data entered #2 Problem List/Diagnoses reviewed Plan:  Assessments made/ Orders entered  

## 2013-01-12 NOTE — Patient Instructions (Addendum)
Minimal Blood Pressure Goal= AVERAGE < 140/90;  Ideal is an AVERAGE < 135/85. This AVERAGE should be calculated from @ least 5-7 BP readings taken @ different times of day on different days of week. You should not respond to isolated BP readings , but rather the AVERAGE for that week .Please bring your  blood pressure cuff to office visits to verify that it is reliable.It  can also be checked against the blood pressure device at the pharmacy. Finger or wrist cuffs are not dependable; an arm cuff is.  If you activate the  My Chart system; lab & Xray results will be released directly  to you as soon as I review & address these through the computer. If you choose not to sign up for My Chart within 36 hours of labs being drawn; results will be reviewed & interpretation added before being copied & mailed, causing a delay in getting the results to you.If you do not receive that report within 7-10 days ,please call. Additionally you can use this system to gain direct  access to your records  if  out of town or @ an office of a  physician who is not in  the My Chart network.  This improves continuity of care & places you in control of your medical record.  

## 2013-01-15 LAB — VITAMIN D 1,25 DIHYDROXY
Vitamin D 1, 25 (OH)2 Total: 59 pg/mL (ref 18–72)
Vitamin D2 1, 25 (OH)2: <8 pg/mL
Vitamin D3 1, 25 (OH)2: 59 pg/mL

## 2013-02-11 ENCOUNTER — Ambulatory Visit (INDEPENDENT_AMBULATORY_CARE_PROVIDER_SITE_OTHER): Payer: Medicare Other

## 2013-02-11 DIAGNOSIS — Z23 Encounter for immunization: Secondary | ICD-10-CM

## 2013-12-07 ENCOUNTER — Encounter: Payer: Self-pay | Admitting: Internal Medicine

## 2014-01-26 ENCOUNTER — Other Ambulatory Visit (HOSPITAL_COMMUNITY): Payer: Self-pay | Admitting: Internal Medicine

## 2014-01-26 ENCOUNTER — Ambulatory Visit (HOSPITAL_COMMUNITY)
Admission: RE | Admit: 2014-01-26 | Discharge: 2014-01-26 | Disposition: A | Discharge status: Home / Self Care | Payer: Medicare Other | Source: Ambulatory Visit | Attending: Internal Medicine | Admitting: Internal Medicine

## 2014-01-26 DIAGNOSIS — R17 Unspecified jaundice: Secondary | ICD-10-CM

## 2014-01-26 DIAGNOSIS — K8020 Calculus of gallbladder without cholecystitis without obstruction: Secondary | ICD-10-CM | POA: Insufficient documentation

## 2014-01-26 DIAGNOSIS — R109 Unspecified abdominal pain: Secondary | ICD-10-CM | POA: Diagnosis present

## 2014-01-26 MED ORDER — IOHEXOL 300 MG/ML  SOLN
80.0000 mL | Freq: Once | INTRAMUSCULAR | Status: AC | PRN
  Administered 2014-01-26: 80 mL via INTRAVENOUS

## 2014-01-27 ENCOUNTER — Encounter: Payer: Self-pay | Admitting: *Deleted

## 2014-01-27 ENCOUNTER — Other Ambulatory Visit (HOSPITAL_COMMUNITY): Payer: Self-pay | Admitting: Internal Medicine

## 2014-01-27 ENCOUNTER — Telehealth: Payer: Self-pay | Admitting: Internal Medicine

## 2014-01-27 DIAGNOSIS — R748 Abnormal levels of other serum enzymes: Secondary | ICD-10-CM

## 2014-01-27 NOTE — Telephone Encounter (Signed)
Dr. Juanda Chance reviewed records.Scheduled on 01/28/14 at 1:30 pm.  Selena Batten aware.

## 2014-01-27 NOTE — Telephone Encounter (Signed)
Left a message for Kim to call back

## 2014-01-28 ENCOUNTER — Encounter (HOSPITAL_COMMUNITY): Payer: Self-pay

## 2014-01-28 ENCOUNTER — Encounter: Payer: Self-pay | Admitting: *Deleted

## 2014-01-28 ENCOUNTER — Ambulatory Visit (HOSPITAL_COMMUNITY): Admission: RE | Admit: 2014-01-28 | Payer: Medicare Other | Source: Ambulatory Visit

## 2014-01-28 ENCOUNTER — Telehealth: Payer: Self-pay

## 2014-01-28 ENCOUNTER — Other Ambulatory Visit (INDEPENDENT_AMBULATORY_CARE_PROVIDER_SITE_OTHER): Payer: Medicare Other

## 2014-01-28 ENCOUNTER — Other Ambulatory Visit: Payer: Self-pay

## 2014-01-28 ENCOUNTER — Ambulatory Visit (INDEPENDENT_AMBULATORY_CARE_PROVIDER_SITE_OTHER): Payer: Medicare Other | Admitting: Internal Medicine

## 2014-01-28 ENCOUNTER — Encounter (HOSPITAL_COMMUNITY): Payer: Self-pay | Admitting: *Deleted

## 2014-01-28 VITALS — BP 140/72 | HR 72 | Ht 61.0 in | Wt 109.5 lb

## 2014-01-28 DIAGNOSIS — R933 Abnormal findings on diagnostic imaging of other parts of digestive tract: Secondary | ICD-10-CM

## 2014-01-28 DIAGNOSIS — K831 Obstruction of bile duct: Secondary | ICD-10-CM

## 2014-01-28 DIAGNOSIS — R1013 Epigastric pain: Secondary | ICD-10-CM

## 2014-01-28 LAB — HEPATIC FUNCTION PANEL
ALK PHOS: 158 U/L — AB (ref 39–117)
ALT: 220 U/L — AB (ref 0–35)
AST: 106 U/L — ABNORMAL HIGH (ref 0–37)
Albumin: 3.9 g/dL (ref 3.5–5.2)
BILIRUBIN DIRECT: 2.8 mg/dL — AB (ref 0.0–0.3)
BILIRUBIN TOTAL: 4.6 mg/dL — AB (ref 0.2–1.2)
TOTAL PROTEIN: 7 g/dL (ref 6.0–8.3)

## 2014-01-28 MED ORDER — CIPROFLOXACIN HCL 500 MG PO TABS: 500.0000 mg | ORAL_TABLET | Freq: Two times a day (BID) | ORAL | Status: DC

## 2014-01-28 NOTE — Telephone Encounter (Signed)
ERCP scheduled, pt instructed and medications reviewed.  Patient instructions mailed to home.  Patient to call with any questions or concerns.  

## 2014-01-28 NOTE — Progress Notes (Signed)
Teresa Morris 1941-04-14 [id number]  Note: This dictation was prepared with Dragon digital system. Any transcriptional errors that result from this procedure are unintentional.   History of Present Illness:  This is a 73 year old white female referred by Dr. Waynard Edwards for abnormal LFTs. She gives hx of intermittent epigastric pain anteriorly for past 4 weeks  associated with nausea. The pain has been  mostly at night and postprandially. She is a former patient of Dr. Alwyn Ren. She started seeing Dr. Waynard Edwards last summer and had a complete physical exam in July 2015. On her routine lab work, her liver function tests were elevated. On September 23, her total bilirubin was 7.8 with direct bilirubin of 5.7, AST of 116 and ALT 336. Her alkaline phosphatase was 158. A CT scan of the abdomen which Dr. Waynard Edwards ordered on 01/27/2014, revealed  cholelithiasis , normal thickness  gallbladder without  sign of cholecystitis. Her common bile duct was normal size and there was no intrahepatic duct dilatation. There was no pancreatic mass on adenopathy. Patient was not aware of having gallstones. Her son had  gallbladder removed about 15 years ago. Pt had normal liver function tests in 2010, 2011, 2012 and 2013 as well as in September 2014. Additional medical problems include high blood pressure, hyperlipidemia and cervical and lumbar radiculopathy. We did a screening colonoscopy in May 2006 which  showed moderately severe diverticulosis of the left colon. Patient denies  chills or fever. Her stools have been  clay color and  urine has been tea color. There has been no significant weight loss. Her level of energy has been good. She was started on Prilosec 20 mg daily about 3 days ago.    Past Medical History  Diagnosis Date  . Hypertension     Eclampsia 1958  . Hyperlipidemia 2012    LDL 170  . Osteopenia 2004    T score -2.2, Dr Vickey Sages, [place]  . Pneumothorax 1962    spontaneous  . Fasting hyperglycemia 2011   FBS 112  . Cholelithiasis   . Diverticulosis   . Seizure disorder during pregnancy   . Pneumonia     As a child    Past Surgical History  Procedure Laterality Date  . Cesarean section      X 2  . Foot surgery    . Colonoscopy  2001    Negative  . Colonoscopy  2006    Tics  . Cataract extraction, bilateral  2008 & 2010    Dr Dagoberto Ligas  . Chest tube insertion  1962    Dr Blinda Leatherwood    No Known Allergies  Family history and social history have been reviewed.  Review of Systems: Negative for weight loss diarrhea fever  The remainder of the 10 point ROS is negative except as outlined in the H&P  Physical Exam: General Appearance Well developed, in no distress Eyes   icteric  HEENT  Non traumatic, normocephalic  Mouth No lesion, tongue papillated, no cheilosis Neck Supple without adenopathy, thyroid not enlarged, no carotid bruits, no JVD Lungs Clear to auscultation bilaterally COR Normal S1, normal S2, regular rhythm, no murmur, quiet precordium Abdomen soft tender in right upper quadrant and epigastrium. No rebound. Liver edge at costal margin. No distention. Quiet bowel sounds. No CVA tenderness Rectal soft pale Hemoccult negative stool Extremities  No pedal edema Skin very mild jaundice, no spider nevi Neurological Alert and oriented x 3 Psychological Normal mood and affect  Assessment and Plan:   Problem #1  73 year old white female with obstructive jaundice of several weeks duration. She has cholelithiasis but no evidence of choledocholithiasis on CT scan. I still feel that choledocholithiasis is most likely the cause of her jaundice. Ampullary adenocarcinoma or small pancreatic tumor are  a consideration. She so far has not had any signs of cholangitis. We will start  Cipro 500 mg twice a day and will repeat  liver function tests today. I have talked to Dr. Christella Hartigan who will be performing ERCP with sphincterotomy and possible stone extraction tomorrow at 10:00 at  [facility]. I have discussed the problem with the patient. I have discussed sedation during the procedure as well as the procedure itself. She will need a laparoscopic cholecystectomy. I mentioned possible post ERCP pancreatitis as a complication  She understands and will follow  our instructions.    Lina Sar 01/28/2014

## 2014-01-28 NOTE — Patient Instructions (Addendum)
We have sent the following medications to your pharmacy for you to pick up at your convenience: Cipro 500 mg twice daily x 7 days.  Per Dr. Juanda Chance, go ahead and take one pill tonight.  Your physician has requested that you go to the basement for the following lab work before leaving today: Hepatic function  You have been given instructions on how to prepare for your ERCP tomorrow.  Dr Waynard Edwards, Dr Christella Hartigan

## 2014-01-28 NOTE — Telephone Encounter (Signed)
Message copied by Donata Duff on Thu Jan 28, 2014  8:20 AM ------      Message from: Rob Bunting P      Created: Thu Jan 28, 2014  7:49 AM      Regarding: RE: possible ERCP       Verlee Monte,      I looked at the films, agree.  Let me know after you see her.  Would put on cipro, repeat LFTs.            Makya Phillis,      Can you see about tentatively scheduling her for ERCP tomorrow at [facility] (or [facility], wherever it can be done), needs anesthesia assistance (general or MAC).              Thanks            ----- Message -----         From: Hart Carwin, MD         Sent: 01/27/2014   5:09 PM           To: Rachael Fee, MD      Subject: possible ERCP                                            Jesusita Oka, busy week for you! I was called by Rodrigo Ran to see this lady- add on for tomorrow.Sounds like obstructive jaundice. Abd. Pain, CT scan reveal cholelithiasis , the CBD is normal size. Total bili 8.7, direct 5.5, elevated enzymes. No mass on CT scan. I will let you know when I see her tomorrow but perspectively she may need an intervention before the weekend. Thanx Dora       ------

## 2014-01-28 NOTE — Telephone Encounter (Signed)
Noreene Larsson is working on getting this scheduled at [facility]

## 2014-01-29 ENCOUNTER — Encounter (HOSPITAL_COMMUNITY)
Admission: RE | Disposition: A | Discharge status: Home / Self Care | Payer: Self-pay | Source: Ambulatory Visit | Attending: Gastroenterology

## 2014-01-29 ENCOUNTER — Encounter (HOSPITAL_COMMUNITY): Admission: RE | Payer: Self-pay | Source: Ambulatory Visit

## 2014-01-29 ENCOUNTER — Ambulatory Visit (HOSPITAL_COMMUNITY): Payer: Medicare Other | Admitting: Anesthesiology

## 2014-01-29 ENCOUNTER — Telehealth: Payer: Self-pay | Admitting: *Deleted

## 2014-01-29 ENCOUNTER — Ambulatory Visit (HOSPITAL_COMMUNITY): Payer: Medicare Other

## 2014-01-29 ENCOUNTER — Encounter (HOSPITAL_COMMUNITY): Payer: Self-pay | Admitting: Anesthesiology

## 2014-01-29 ENCOUNTER — Encounter (HOSPITAL_COMMUNITY): Payer: Medicare Other | Admitting: Anesthesiology

## 2014-01-29 ENCOUNTER — Ambulatory Visit (HOSPITAL_COMMUNITY)
Admission: RE | Admit: 2014-01-29 | Discharge: 2014-01-29 | Disposition: A | Discharge status: Home / Self Care | Payer: Medicare Other | Source: Ambulatory Visit | Attending: Gastroenterology | Admitting: Gastroenterology

## 2014-01-29 ENCOUNTER — Ambulatory Visit (HOSPITAL_COMMUNITY): Admission: RE | Admit: 2014-01-29 | Payer: Medicare Other | Source: Ambulatory Visit | Admitting: Gastroenterology

## 2014-01-29 DIAGNOSIS — I10 Essential (primary) hypertension: Secondary | ICD-10-CM | POA: Insufficient documentation

## 2014-01-29 DIAGNOSIS — R932 Abnormal findings on diagnostic imaging of liver and biliary tract: Secondary | ICD-10-CM

## 2014-01-29 DIAGNOSIS — K8309 Other cholangitis: Secondary | ICD-10-CM | POA: Insufficient documentation

## 2014-01-29 DIAGNOSIS — M899 Disorder of bone, unspecified: Secondary | ICD-10-CM | POA: Insufficient documentation

## 2014-01-29 DIAGNOSIS — K5730 Diverticulosis of large intestine without perforation or abscess without bleeding: Secondary | ICD-10-CM | POA: Insufficient documentation

## 2014-01-29 DIAGNOSIS — K838 Other specified diseases of biliary tract: Secondary | ICD-10-CM | POA: Diagnosis present

## 2014-01-29 DIAGNOSIS — IMO0002 Reserved for concepts with insufficient information to code with codable children: Secondary | ICD-10-CM | POA: Diagnosis not present

## 2014-01-29 DIAGNOSIS — K831 Obstruction of bile duct: Secondary | ICD-10-CM

## 2014-01-29 DIAGNOSIS — E785 Hyperlipidemia, unspecified: Secondary | ICD-10-CM | POA: Diagnosis not present

## 2014-01-29 DIAGNOSIS — M5412 Radiculopathy, cervical region: Secondary | ICD-10-CM | POA: Insufficient documentation

## 2014-01-29 DIAGNOSIS — Z87891 Personal history of nicotine dependence: Secondary | ICD-10-CM | POA: Insufficient documentation

## 2014-01-29 DIAGNOSIS — K8050 Calculus of bile duct without cholangitis or cholecystitis without obstruction: Secondary | ICD-10-CM

## 2014-01-29 DIAGNOSIS — M949 Disorder of cartilage, unspecified: Secondary | ICD-10-CM

## 2014-01-29 DIAGNOSIS — K219 Gastro-esophageal reflux disease without esophagitis: Secondary | ICD-10-CM | POA: Insufficient documentation

## 2014-01-29 DIAGNOSIS — K8040 Calculus of bile duct with cholecystitis, unspecified, without obstruction: Secondary | ICD-10-CM

## 2014-01-29 HISTORY — DX: Nausea with vomiting, unspecified: Z98.890

## 2014-01-29 HISTORY — PX: ERCP: SHX5425

## 2014-01-29 HISTORY — DX: Nausea with vomiting, unspecified: R11.2

## 2014-01-29 HISTORY — DX: Gastro-esophageal reflux disease without esophagitis: K21.9

## 2014-01-29 SURGERY — ENDOSCOPIC RETROGRADE CHOLANGIOPANCREATOGRAPHY (ERCP) WITH PROPOFOL
Anesthesia: General

## 2014-01-29 SURGERY — ERCP, WITH INTERVENTION IF INDICATED
Anesthesia: General

## 2014-01-29 MED ORDER — LACTATED RINGERS IV SOLN
INTRAVENOUS | Status: DC
  Administered 2014-01-29: 07:00:00 via INTRAVENOUS

## 2014-01-29 MED ORDER — PROMETHAZINE HCL 25 MG/ML IJ SOLN: 6.2500 mg | INTRAMUSCULAR | Status: DC | PRN

## 2014-01-29 MED ORDER — CIPROFLOXACIN IN D5W 400 MG/200ML IV SOLN
INTRAVENOUS | Status: AC
  Filled 2014-01-29: qty 200

## 2014-01-29 MED ORDER — IOHEXOL 350 MG/ML SOLN
INTRAVENOUS | Status: DC | PRN
  Administered 2014-01-29: 08:00:00

## 2014-01-29 MED ORDER — OXYCODONE HCL 5 MG PO TABS: 5.0000 mg | ORAL_TABLET | Freq: Once | ORAL | Status: DC | PRN

## 2014-01-29 MED ORDER — OXYCODONE HCL 5 MG/5ML PO SOLN: 5.0000 mg | Freq: Once | ORAL | Status: DC | PRN

## 2014-01-29 MED ORDER — SODIUM CHLORIDE 0.9 % IV SOLN: INTRAVENOUS | Status: DC

## 2014-01-29 MED ORDER — FENTANYL CITRATE 0.05 MG/ML IJ SOLN
25.0000 ug | INTRAMUSCULAR | Status: DC | PRN
Start: 2014-01-29 — End: 2014-01-29

## 2014-01-29 MED ORDER — MEPERIDINE HCL 100 MG/ML IJ SOLN: 6.2500 mg | INTRAMUSCULAR | Status: DC | PRN

## 2014-01-29 MED ORDER — PROPOFOL 10 MG/ML IV BOLUS
INTRAVENOUS | Status: DC | PRN
  Administered 2014-01-29: 150 mg via INTRAVENOUS

## 2014-01-29 MED ORDER — SUCCINYLCHOLINE CHLORIDE 20 MG/ML IJ SOLN
INTRAMUSCULAR | Status: DC | PRN
  Administered 2014-01-29: 100 mg via INTRAVENOUS

## 2014-01-29 MED ORDER — ONDANSETRON HCL 4 MG/2ML IJ SOLN
INTRAMUSCULAR | Status: DC | PRN
  Administered 2014-01-29: 4 mg via INTRAVENOUS

## 2014-01-29 MED ORDER — LIDOCAINE HCL (CARDIAC) 20 MG/ML IV SOLN
INTRAVENOUS | Status: DC | PRN
  Administered 2014-01-29: 10 mg via INTRAVENOUS

## 2014-01-29 MED ORDER — DIPHENHYDRAMINE HCL 50 MG/ML IJ SOLN
INTRAMUSCULAR | Status: DC | PRN
  Administered 2014-01-29: 5 mg via INTRAVENOUS

## 2014-01-29 MED ORDER — CIPROFLOXACIN IN D5W 400 MG/200ML IV SOLN
400.0000 mg | Freq: Once | INTRAVENOUS | Status: AC
  Administered 2014-01-29: 400 mg via INTRAVENOUS

## 2014-01-29 MED ORDER — INDOMETHACIN 50 MG RE SUPP
100.0000 mg | Freq: Once | RECTAL | Status: AC
  Administered 2014-01-29: 100 mg via RECTAL
  Filled 2014-01-29: qty 2

## 2014-01-29 NOTE — Discharge Instructions (Signed)

## 2014-01-29 NOTE — Transfer of Care (Signed)
Immediate Anesthesia Transfer of Care Note  Patient: Teresa Morris  Procedure(s) Performed: Procedure(s): ENDOSCOPIC RETROGRADE CHOLANGIOPANCREATOGRAPHY (ERCP) (N/A)  Patient Location: Endoscopy Unit  Anesthesia Type:General  Level of Consciousness: awake, alert  and oriented  Airway & Oxygen Therapy: Patient Spontanous Breathing and Patient connected to nasal cannula oxygen  Post-op Assessment: Report given to PACU RN and Post -op Vital signs reviewed and stable  Post vital signs: Reviewed and stable  Complications: No apparent anesthesia complications

## 2014-01-29 NOTE — Interval H&P Note (Signed)
History and Physical Interval Note:  01/29/2014 7:20 AM  Teresa Morris  has presented today for surgery, with the diagnosis of ABNORMAL FINDINGS  The various methods of treatment have been discussed with the patient and family. After consideration of risks, benefits and other options for treatment, the patient has consented to  Procedure(s): ENDOSCOPIC RETROGRADE CHOLANGIOPANCREATOGRAPHY (ERCP) (N/A) as a surgical intervention .  The patient's history has been reviewed, patient examined, no change in status, stable for surgery.  I have reviewed the patient's chart and labs.  Questions were answered to the patient's satisfaction.     Rachael Fee

## 2014-01-29 NOTE — Anesthesia Postprocedure Evaluation (Signed)
  Anesthesia Post-op Note  Patient: Teresa Morris  Procedure(s) Performed: Procedure(s): ENDOSCOPIC RETROGRADE CHOLANGIOPANCREATOGRAPHY (ERCP) (N/A)  Patient Location: Endoscopy Unit  Anesthesia Type:General  Level of Consciousness: awake, alert , oriented and patient cooperative  Airway and Oxygen Therapy: Patient Spontanous Breathing  Post-op Pain: none  Post-op Assessment: Post-op Vital signs reviewed, Patient's Cardiovascular Status Stable, Respiratory Function Stable, Patent Airway, No signs of Nausea or vomiting and Pain level controlled  Post-op Vital Signs: Reviewed and stable  Last Vitals:  Filed Vitals:   01/29/14 0850  BP: 130/77  Pulse: 71  Temp:   Resp: 14    Complications: No apparent anesthesia complications

## 2014-01-29 NOTE — H&P (View-Only) (Signed)
Teresa Morris 1941-04-14 [id number]  Note: This dictation was prepared with Dragon digital system. Any transcriptional errors that result from this procedure are unintentional.   History of Present Illness:  This is a 73 year old white female referred by Dr. Waynard Edwards for abnormal LFTs. She gives hx of intermittent epigastric pain anteriorly for past 4 weeks  associated with nausea. The pain has been  mostly at night and postprandially. She is a former patient of Dr. Alwyn Ren. She started seeing Dr. Waynard Edwards last summer and had a complete physical exam in July 2015. On her routine lab work, her liver function tests were elevated. On September 23, her total bilirubin was 7.8 with direct bilirubin of 5.7, AST of 116 and ALT 336. Her alkaline phosphatase was 158. A CT scan of the abdomen which Dr. Waynard Edwards ordered on 01/27/2014, revealed  cholelithiasis , normal thickness  gallbladder without  sign of cholecystitis. Her common bile duct was normal size and there was no intrahepatic duct dilatation. There was no pancreatic mass on adenopathy. Patient was not aware of having gallstones. Her son had  gallbladder removed about 15 years ago. Pt had normal liver function tests in 2010, 2011, 2012 and 2013 as well as in September 2014. Additional medical problems include high blood pressure, hyperlipidemia and cervical and lumbar radiculopathy. We did a screening colonoscopy in May 2006 which  showed moderately severe diverticulosis of the left colon. Patient denies  chills or fever. Her stools have been  clay color and  urine has been tea color. There has been no significant weight loss. Her level of energy has been good. She was started on Prilosec 20 mg daily about 3 days ago.    Past Medical History  Diagnosis Date  . Hypertension     Eclampsia 1958  . Hyperlipidemia 2012    LDL 170  . Osteopenia 2004    T score -2.2, Dr Vickey Sages, [place] [facility]  . Pneumothorax 1962    spontaneous  . Fasting hyperglycemia 2011   FBS 112  . Cholelithiasis   . Diverticulosis   . Seizure disorder during pregnancy   . Pneumonia     As a child    Past Surgical History  Procedure Laterality Date  . Cesarean section      X 2  . Foot surgery    . Colonoscopy  2001    Negative  . Colonoscopy  2006    Tics  . Cataract extraction, bilateral  2008 & 2010    Dr Dagoberto Ligas  . Chest tube insertion  1962    Dr Blinda Leatherwood    No Known Allergies  Family history and social history have been reviewed.  Review of Systems: Negative for weight loss diarrhea fever  The remainder of the 10 point ROS is negative except as outlined in the H&P  Physical Exam: General Appearance Well developed, in no distress Eyes   icteric  HEENT  Non traumatic, normocephalic  Mouth No lesion, tongue papillated, no cheilosis Neck Supple without adenopathy, thyroid not enlarged, no carotid bruits, no JVD Lungs Clear to auscultation bilaterally COR Normal S1, normal S2, regular rhythm, no murmur, quiet precordium Abdomen soft tender in right upper quadrant and epigastrium. No rebound. Liver edge at costal margin. No distention. Quiet bowel sounds. No CVA tenderness Rectal soft pale Hemoccult negative stool Extremities  No pedal edema Skin very mild jaundice, no spider nevi Neurological Alert and oriented x 3 Psychological Normal mood and affect  Assessment and Plan:   Problem #1  73-year-old white female with obstructive jaundice of several weeks duration. She has cholelithiasis but no evidence of choledocholithiasis on CT scan. I still feel that choledocholithiasis is most likely the cause of her jaundice. Ampullary adenocarcinoma or small pancreatic tumor are  a consideration. She so far has not had any signs of cholangitis. We will start  Cipro 500 mg twice a day and will repeat  liver function tests today. I have talked to Dr. Jacobs who will be performing ERCP with sphincterotomy and possible stone extraction tomorrow at 10:00 at  [facility]. I have discussed the problem with the patient. I have discussed sedation during the procedure as well as the procedure itself. She will need a laparoscopic cholecystectomy. I mentioned possible post ERCP pancreatitis as a complication  She understands and will follow  our instructions.    Dontell Mian 01/28/2014      

## 2014-01-29 NOTE — Anesthesia Procedure Notes (Signed)
Procedure Name: Intubation Date/Time: 01/29/2014 7:50 AM Performed by: Arlice Colt B Pre-anesthesia Checklist: Patient identified, Emergency Drugs available, Suction available, Patient being monitored and Timeout performed Patient Re-evaluated:Patient Re-evaluated prior to inductionOxygen Delivery Method: Circle system utilized Preoxygenation: Pre-oxygenation with 100% oxygen Intubation Type: IV induction and Rapid sequence Laryngoscope Size: Mac and 3 Grade View: Grade I Tube type: Oral Tube size: 7.5 mm Number of attempts: 1 Airway Equipment and Method: Stylet Placement Confirmation: ETT inserted through vocal cords under direct vision,  positive ETCO2 and breath sounds checked- equal and bilateral Secured at: 20 cm Tube secured with: Tape Dental Injury: Teeth and Oropharynx as per pre-operative assessment

## 2014-01-29 NOTE — Telephone Encounter (Signed)
Per previous note, Vernia Buff as made the referral for patient.

## 2014-01-29 NOTE — Telephone Encounter (Signed)
The following was a review note sent from Dr Christella Hartigan: "I sent Teresa Morris an epic note this AM but she may be off. Teresa Morris needs referral to [facility] to consider lap chole for gallstone disease, recent cholangitis, CBD stone. Thanks". I have scheduled an appointment with Dr Luisa Hart for 02/11/14 @ 3:00 pm with a 2:30 pm arrival. I will advise patient of this on Monday since she has been sedated for ERCP today.

## 2014-01-29 NOTE — Op Note (Signed)
[facility] [address] [place], [address]   ERCP PROCEDURE REPORT        EXAM DATE: 01/29/2014  PATIENT NAME:          Teresa Morris, Teresa Morris          MR #: [id number] BIRTHDATE:       Sep 10, 1940     VISIT #:     [id number] ATTENDING:     Crystalle Popwell P Desaray Marschner, MD STATUS:     outpatient ASSISTANT:      Akande, Felicia and Ford, Patricia  INDICATIONS:  The patient is a 73 yr old female here for an ERCP due to intermittent abd pain, post prandial nausea, elevated liver tests; CT scan showed multiple gallstones in GB and suggested distal CBD stone. PROCEDURE PERFORMED:     ERCP with sphincterotomy/papillotomy ERCP with removal of calculus/calculi MEDICATIONS:     Per Anesthesia, cipro 400mg  IV, indomethacin 100mg  PR  CONSENT: The patient understands the risks and benefits of the procedure and understands that these risks include, but are not limited to: sedation, allergic reaction, infection, perforation and/or bleeding. Alternative means of evaluation and treatment include, among others: physical exam, x-rays, and/or surgical intervention. The patient elects to proceed with this endoscopic procedure.  DESCRIPTION OF PROCEDURE: During intra-op preparation period all mechanical & medical equipment was checked for proper function. Hand hygiene and appropriate measures for infection prevention was taken. After the risks, benefits and [id number] [place] procedure were thoroughly explained, Informed [id number] liary balloon was used to sweep the bile duct several times. There was dilivery of white purulence and a single greenish gallstone into the duodenum.   A completion, occlusion cholangiogram showed no further filling defects. The main pancreatic duct was cannulated with wire a single time but never injected with dye.   ADVERSE EVENT:     None  IMPRESSIONS:     Choledocholiathiasis and purulent cholangitis. Treated with biliary sphincterotomy and balloon sweeping.   RECOMMENDATIONS:     She will continue oral cipro (500mg  twice daily for another 7 days). [facility] will arrange for general surgery consult as an outpatient.   __________________________________ Albaro Deviney P Tjuana Vickrey, MD eSigned:  Vane Yapp P Esha Fincher, MD 01/29/2014 8:14 AM   cc: Dora Brodie,  MD; Rodrigo Ran, MD  CPT CODES: ICD9 CODES:  The ICD and CPT codes recommended by this software are interpretations from the data that the clinical staff has captured with the software.  The verification of the  translation of this report to the ICD and CPT codes and modifiers is the sole responsibility of the health care institution and practicing physician where this report was generated.  [employer] will not be held responsible for the validity of the ICD and CPT codes included on this report.  AMA assumes no liability for data contained or not contained herein. CPT is a Publishing rights manager of the [employer].   PATIENT NAME:  Teresa Morris, Teresa Morris MR#: [id number]

## 2014-01-29 NOTE — Telephone Encounter (Signed)
Message copied by Daphine Deutscher on Fri Jan 29, 2014  2:19 PM ------      Message from: Hart Carwin      Created: Fri Jan 29, 2014 12:38 PM      Regarding: surgical referral       Rene Kocher, could you, please make a surgical referral for lap chole. I have discussed it with the pt.      ----- Message -----         From: Rachael Fee, MD         Sent: 01/29/2014   8:16 AM           To: Hart Carwin, MD            Verlee Monte,      Just completed ERCP, went smoothly.  Stone in bile duct and pus.            She seems fine to continue as outpatient, cipro twice daily. She will need referral to general surgery for lap chole.  I'll leave that to you if that is OK.            Thanks            DJ       ------

## 2014-01-29 NOTE — Anesthesia Preprocedure Evaluation (Addendum)
Anesthesia Evaluation  Patient identified by MRN, date of birth, ID band Patient awake    Reviewed: Allergy & Precautions, H&P , NPO status , Patient's Chart, lab work & pertinent test results  History of Anesthesia Complications (+) PONV and history of anesthetic complications  Airway Mallampati: II TM Distance: >3 FB Neck ROM: Full    Dental  (+) Teeth Intact, Dental Advisory Given   Pulmonary former smoker (quit 1962),  breath sounds clear to auscultation        Cardiovascular hypertension, Pt. on medications - anginaRhythm:Regular Rate:Normal     Neuro/Psych negative neurological ROS     GI/Hepatic GERD-  Controlled,Elevated LFTs: cholelithiasis   Endo/Other  negative endocrine ROS  Renal/GU negative Renal ROS     Musculoskeletal   Abdominal   Peds  Hematology negative hematology ROS (+)   Anesthesia Other Findings   Reproductive/Obstetrics                         Anesthesia Physical Anesthesia Plan  ASA: II  Anesthesia Plan: General   Post-op Pain Management:    Induction: Intravenous  Airway Management Planned: Oral ETT  Additional Equipment:   Intra-op Plan:   Post-operative Plan: Extubation in OR  Informed Consent: I have reviewed the patients History and Physical, chart, labs and discussed the procedure including the risks, benefits and alternatives for the proposed anesthesia with the patient or authorized representative who has indicated his/her understanding and acceptance.   Dental advisory given  Plan Discussed with: CRNA, Anesthesiologist and Surgeon  Anesthesia Plan Comments: (Plan routine monitors, GETA)       Anesthesia Quick Evaluation

## 2014-02-01 ENCOUNTER — Encounter (HOSPITAL_COMMUNITY): Payer: Self-pay | Admitting: Gastroenterology

## 2014-02-01 NOTE — Telephone Encounter (Signed)
I have spoken to patient and have advised of her appointment with Dr Mignon Pine on 02/11/14. She verbalizes understanding.

## 2014-02-19 ENCOUNTER — Other Ambulatory Visit: Payer: Self-pay

## 2014-02-23 ENCOUNTER — Encounter (HOSPITAL_COMMUNITY): Payer: Self-pay | Admitting: Pharmacy Technician

## 2014-02-24 ENCOUNTER — Encounter (HOSPITAL_COMMUNITY)
Admission: RE | Admit: 2014-02-24 | Discharge: 2014-02-24 | Disposition: A | Discharge status: Home / Self Care | Payer: Medicare Other | Source: Ambulatory Visit | Attending: General Surgery | Admitting: General Surgery

## 2014-02-24 ENCOUNTER — Encounter (HOSPITAL_COMMUNITY)
Admission: RE | Admit: 2014-02-24 | Discharge: 2014-02-24 | Disposition: A | Discharge status: Home / Self Care | Payer: Medicare Other | Source: Ambulatory Visit | Attending: Anesthesiology | Admitting: Anesthesiology

## 2014-02-24 ENCOUNTER — Telehealth (INDEPENDENT_AMBULATORY_CARE_PROVIDER_SITE_OTHER): Payer: Self-pay | Admitting: General Surgery

## 2014-02-24 ENCOUNTER — Encounter (HOSPITAL_COMMUNITY): Payer: Self-pay

## 2014-02-24 ENCOUNTER — Other Ambulatory Visit (INDEPENDENT_AMBULATORY_CARE_PROVIDER_SITE_OTHER): Payer: Self-pay | Admitting: General Surgery

## 2014-02-24 DIAGNOSIS — Z01811 Encounter for preprocedural respiratory examination: Secondary | ICD-10-CM

## 2014-02-24 DIAGNOSIS — Z01818 Encounter for other preprocedural examination: Secondary | ICD-10-CM | POA: Diagnosis not present

## 2014-02-24 DIAGNOSIS — M419 Scoliosis, unspecified: Secondary | ICD-10-CM | POA: Diagnosis not present

## 2014-02-24 DIAGNOSIS — K8050 Calculus of bile duct without cholangitis or cholecystitis without obstruction: Secondary | ICD-10-CM | POA: Insufficient documentation

## 2014-02-24 LAB — CBC
HCT: 41.9 % (ref 36.0–46.0)
HEMOGLOBIN: 13.4 g/dL (ref 12.0–15.0)
MCH: 29.2 pg (ref 26.0–34.0)
MCHC: 32 g/dL (ref 30.0–36.0)
MCV: 91.3 fL (ref 78.0–100.0)
Platelets: 292 10*3/uL (ref 150–400)
RBC: 4.59 MIL/uL (ref 3.87–5.11)
RDW: 13.5 % (ref 11.5–15.5)
WBC: 7.9 10*3/uL (ref 4.0–10.5)

## 2014-02-24 LAB — DIFFERENTIAL
Basophils Absolute: 0.1 10*3/uL (ref 0.0–0.1)
Basophils Relative: 1 % (ref 0–1)
Eosinophils Absolute: 0.2 10*3/uL (ref 0.0–0.7)
Eosinophils Relative: 2 % (ref 0–5)
LYMPHS PCT: 33 % (ref 12–46)
Lymphs Abs: 2.8 10*3/uL (ref 0.7–4.0)
MONOS PCT: 11 % (ref 3–12)
Monocytes Absolute: 0.9 10*3/uL (ref 0.1–1.0)
NEUTROS ABS: 4.4 10*3/uL (ref 1.7–7.7)
Neutrophils Relative %: 53 % (ref 43–77)

## 2014-02-24 LAB — HEPATIC FUNCTION PANEL
ALBUMIN: 4.1 g/dL (ref 3.5–5.2)
ALT: 24 U/L (ref 0–35)
AST: 53 U/L — AB (ref 0–37)
Alkaline Phosphatase: 73 U/L (ref 39–117)
BILIRUBIN TOTAL: 0.6 mg/dL (ref 0.3–1.2)
Bilirubin, Direct: 0.2 mg/dL (ref 0.0–0.3)
Indirect Bilirubin: 0.4 mg/dL (ref 0.3–0.9)
Total Protein: 7.5 g/dL (ref 6.0–8.3)

## 2014-02-24 LAB — BASIC METABOLIC PANEL
ANION GAP: 10 (ref 5–15)
BUN: 13 mg/dL (ref 6–23)
CO2: 28 meq/L (ref 19–32)
Calcium: 9.7 mg/dL (ref 8.4–10.5)
Chloride: 103 mEq/L (ref 96–112)
Creatinine, Ser: 0.85 mg/dL (ref 0.50–1.10)
GFR calc Af Amer: 76 mL/min — ABNORMAL LOW (ref >90)
GFR calc non Af Amer: 66 mL/min — ABNORMAL LOW (ref >90)
Glucose, Bld: 97 mg/dL (ref 70–99)
Potassium: 5.8 mEq/L — ABNORMAL HIGH (ref 3.7–5.3)
SODIUM: 141 meq/L (ref 137–147)

## 2014-02-24 NOTE — Telephone Encounter (Signed)
Aram Beechamynthia, Short stay nurse, called to let us know that they had drawn a CBC and BMET for this patient because they were waiting on Dr. Doreen SalvageWakefield's order.  Dwain SarnaWakefield also wanted a CBC w/ Diff and CMET for this patient.  They were able to catch the lab in time to have these drawn also.  The patient's potassium level did come back high with 5.8 and Dr. Dwain SarnaWakefield wants this checked again tmw morning.  Aram BeechamCynthia placed the orders for this and the patient has been informed.  Also informed cynthia that the EKG came back showing changes and Dr. Dwain SarnaWakefield would like this read by a cardiologist and the anesthesiologist while comparing to an old EKG strip.  She informed me that this has already been set up.

## 2014-02-24 NOTE — Pre-Procedure Instructions (Signed)
Teresa Morris  02/24/2014   Your procedure is scheduled on:  03-01-2013 Monday   [facility] at 8:00 AM.   Call this number if you have problems the morning of surgery: [phone]   Remember:   Do not eat food or drink liquids after midnight.    Take these medicines the morning of surgery with A SIP OF WATER: amlodipine(Norvasc)   Do not wear jewelry, make-up or nail polish.  Do not wear lotions, powders, or perfumes. You may not wear deodorant.  Do not shave 48 hours prior to surgery. Men may shave face and neck.  Do not bring valuables to the hospital.  [facility] is not responsible for any belongings or valuables.               Contacts, dentures or bridgework may not be worn into surgery.   Leave suitcase in the car. After surgery it may be brought to your room.  For patients admitted to the hospital, discharge time is determined by your  treatment team.               Patients discharged the day of surgery will not be allowed to drive home.   Name and phone number of your driver:    Special Instructions: See Attached sheet for instructions on CHG /bath   Please read over the following fact sheets that you were given: Pain Booklet, Coughing and Deep Breathing and Surgical Site Infection Prevention

## 2014-02-24 NOTE — Progress Notes (Signed)
Office notified that we need orders on patient.

## 2014-02-24 NOTE — Progress Notes (Signed)
Dr. Dwain SarnaWakefield wants CMET repeated on 02-25-2014.  [place]

## 2014-02-25 DIAGNOSIS — K8010 Calculus of gallbladder with chronic cholecystitis without obstruction: Secondary | ICD-10-CM | POA: Diagnosis not present

## 2014-02-25 DIAGNOSIS — Z87891 Personal history of nicotine dependence: Secondary | ICD-10-CM | POA: Diagnosis not present

## 2014-02-25 DIAGNOSIS — K8050 Calculus of bile duct without cholangitis or cholecystitis without obstruction: Secondary | ICD-10-CM | POA: Diagnosis present

## 2014-02-25 DIAGNOSIS — I10 Essential (primary) hypertension: Secondary | ICD-10-CM | POA: Diagnosis not present

## 2014-02-25 LAB — COMPREHENSIVE METABOLIC PANEL
ALK PHOS: 79 U/L (ref 39–117)
ALT: 22 U/L (ref 0–35)
AST: 23 U/L (ref 0–37)
Albumin: 4 g/dL (ref 3.5–5.2)
Anion gap: 10 (ref 5–15)
BUN: 13 mg/dL (ref 6–23)
CHLORIDE: 104 meq/L (ref 96–112)
CO2: 28 mEq/L (ref 19–32)
Calcium: 9.8 mg/dL (ref 8.4–10.5)
Creatinine, Ser: 0.81 mg/dL (ref 0.50–1.10)
GFR calc Af Amer: 81 mL/min — ABNORMAL LOW (ref >90)
GFR calc non Af Amer: 70 mL/min — ABNORMAL LOW (ref >90)
GLUCOSE: 96 mg/dL (ref 70–99)
Potassium: 4.6 mEq/L (ref 3.7–5.3)
Sodium: 142 mEq/L (ref 137–147)
Total Bilirubin: 0.9 mg/dL (ref 0.3–1.2)
Total Protein: 7 g/dL (ref 6.0–8.3)

## 2014-02-25 NOTE — Progress Notes (Signed)
Anesthesia Chart Review:  Pt is 73 year old female scheduled for laparoscopic cholecyste67ctomy with cholangiogram on 03/01/2014 with Dr. Dwain SarnaWakefield.   PMH: HTN, hyperlipidemia, GERD  Medications include: amlodipine, benazepril  Preoperative labs reviewed.    Chest x-ray reviewed.  1. Hyperinflation.  2. Scoliosis.  EKG: NSR. Poor R wave progression. Cannot rule out anterior infarct, age undetermined.    If no changes, I anticipate pt can proceed with surgery as scheduled.   Rica Mastngela Kirrah Mustin, FNP-BC [facility]/Anesthesiology Phone: [phone] 02/25/2014 2:01 PM

## 2014-02-25 NOTE — Progress Notes (Signed)
Patient here this am for repeat CMET as ordered.  DA

## 2014-02-28 MED ORDER — CEFAZOLIN SODIUM-DEXTROSE 2-3 GM-% IV SOLR
2.0000 g | INTRAVENOUS | Status: AC
  Administered 2014-03-01: 2 g via INTRAVENOUS
  Filled 2014-02-28: qty 50

## 2014-03-01 ENCOUNTER — Encounter (HOSPITAL_COMMUNITY)
Admission: RE | Disposition: A | Discharge status: Home / Self Care | Payer: Self-pay | Source: Ambulatory Visit | Attending: General Surgery

## 2014-03-01 ENCOUNTER — Ambulatory Visit (HOSPITAL_COMMUNITY)
Admission: RE | Admit: 2014-03-01 | Discharge: 2014-03-01 | Disposition: A | Discharge status: Home / Self Care | Payer: Medicare Other | Source: Ambulatory Visit | Attending: General Surgery | Admitting: General Surgery

## 2014-03-01 ENCOUNTER — Encounter (HOSPITAL_COMMUNITY): Payer: Medicare Other | Admitting: Emergency Medicine

## 2014-03-01 ENCOUNTER — Ambulatory Visit (HOSPITAL_COMMUNITY): Payer: Medicare Other | Admitting: Anesthesiology

## 2014-03-01 ENCOUNTER — Encounter (HOSPITAL_COMMUNITY): Payer: Self-pay | Admitting: *Deleted

## 2014-03-01 DIAGNOSIS — Z87891 Personal history of nicotine dependence: Secondary | ICD-10-CM | POA: Diagnosis not present

## 2014-03-01 DIAGNOSIS — K8010 Calculus of gallbladder with chronic cholecystitis without obstruction: Secondary | ICD-10-CM | POA: Diagnosis not present

## 2014-03-01 DIAGNOSIS — I10 Essential (primary) hypertension: Secondary | ICD-10-CM | POA: Diagnosis not present

## 2014-03-01 HISTORY — PX: CHOLECYSTECTOMY: SHX55

## 2014-03-01 SURGERY — LAPAROSCOPIC CHOLECYSTECTOMY
Anesthesia: General | Site: Abdomen

## 2014-03-01 MED ORDER — HYDROMORPHONE HCL 1 MG/ML IJ SOLN
INTRAMUSCULAR | Status: AC
  Filled 2014-03-01: qty 1

## 2014-03-01 MED ORDER — LIDOCAINE HCL (CARDIAC) 20 MG/ML IV SOLN
INTRAVENOUS | Status: DC | PRN
  Administered 2014-03-01: 50 mg via INTRAVENOUS

## 2014-03-01 MED ORDER — BUPIVACAINE-EPINEPHRINE (PF) 0.25% -1:200000 IJ SOLN
INTRAMUSCULAR | Status: AC
  Filled 2014-03-01: qty 30

## 2014-03-01 MED ORDER — NEOSTIGMINE METHYLSULFATE 10 MG/10ML IV SOLN
INTRAVENOUS | Status: DC | PRN
  Administered 2014-03-01: 3 mg via INTRAVENOUS

## 2014-03-01 MED ORDER — LACTATED RINGERS IV SOLN
INTRAVENOUS | Status: DC
  Administered 2014-03-01: 08:00:00 via INTRAVENOUS

## 2014-03-01 MED ORDER — PROPOFOL 10 MG/ML IV BOLUS
INTRAVENOUS | Status: DC | PRN
  Administered 2014-03-01: 100 mg via INTRAVENOUS

## 2014-03-01 MED ORDER — SODIUM CHLORIDE 0.9 % IV SOLN: INTRAVENOUS | Status: DC | PRN

## 2014-03-01 MED ORDER — BUPIVACAINE-EPINEPHRINE 0.25% -1:200000 IJ SOLN
INTRAMUSCULAR | Status: DC | PRN
  Administered 2014-03-01: 12 mL

## 2014-03-01 MED ORDER — LACTATED RINGERS IV SOLN
INTRAVENOUS | Status: DC | PRN
  Administered 2014-03-01: 10:00:00 via INTRAVENOUS

## 2014-03-01 MED ORDER — OXYCODONE-ACETAMINOPHEN 10-325 MG PO TABS: 1.0000 | ORAL_TABLET | Freq: Four times a day (QID) | ORAL | Status: DC | PRN

## 2014-03-01 MED ORDER — PROPOFOL 10 MG/ML IV BOLUS
INTRAVENOUS | Status: AC
  Filled 2014-03-01: qty 20

## 2014-03-01 MED ORDER — FENTANYL CITRATE 0.05 MG/ML IJ SOLN
INTRAMUSCULAR | Status: AC
  Filled 2014-03-01: qty 5

## 2014-03-01 MED ORDER — DEXAMETHASONE SODIUM PHOSPHATE 4 MG/ML IJ SOLN
INTRAMUSCULAR | Status: DC | PRN
  Administered 2014-03-01: 4 mg via INTRAVENOUS

## 2014-03-01 MED ORDER — ONDANSETRON HCL 4 MG/2ML IJ SOLN
INTRAMUSCULAR | Status: DC | PRN
  Administered 2014-03-01: 4 mg via INTRAVENOUS

## 2014-03-01 MED ORDER — OXYCODONE HCL 5 MG PO TABS: 5.0000 mg | ORAL_TABLET | Freq: Once | ORAL | Status: DC | PRN

## 2014-03-01 MED ORDER — ROCURONIUM BROMIDE 100 MG/10ML IV SOLN
INTRAVENOUS | Status: DC | PRN
  Administered 2014-03-01: 40 mg via INTRAVENOUS

## 2014-03-01 MED ORDER — FENTANYL CITRATE 0.05 MG/ML IJ SOLN
INTRAMUSCULAR | Status: DC | PRN
Start: 2014-03-01 — End: 2014-03-01
  Administered 2014-03-01: 50 ug via INTRAVENOUS
  Administered 2014-03-01: 100 ug via INTRAVENOUS

## 2014-03-01 MED ORDER — HYDROMORPHONE HCL 1 MG/ML IJ SOLN
0.2500 mg | INTRAMUSCULAR | Status: DC | PRN
  Administered 2014-03-01: 0.25 mg via INTRAVENOUS

## 2014-03-01 MED ORDER — SODIUM CHLORIDE 0.9 % IR SOLN
Status: DC | PRN
  Administered 2014-03-01: 1000 mL

## 2014-03-01 MED ORDER — OXYCODONE HCL 5 MG/5ML PO SOLN: 5.0000 mg | Freq: Once | ORAL | Status: DC | PRN

## 2014-03-01 MED ORDER — PROMETHAZINE HCL 25 MG/ML IJ SOLN: 6.2500 mg | INTRAMUSCULAR | Status: DC | PRN

## 2014-03-01 MED ORDER — 0.9 % SODIUM CHLORIDE (POUR BTL) OPTIME
TOPICAL | Status: DC | PRN
  Administered 2014-03-01: 1000 mL

## 2014-03-01 MED ORDER — GLYCOPYRROLATE 0.2 MG/ML IJ SOLN
INTRAMUSCULAR | Status: DC | PRN
  Administered 2014-03-01: .6 mg via INTRAVENOUS

## 2014-03-01 SURGICAL SUPPLY — 48 items
ADH SKN CLS APL DERMABOND .7 (GAUZE/BANDAGES/DRESSINGS) ×2
APPLIER CLIP 5 13 M/L LIGAMAX5 (MISCELLANEOUS) ×3
APR CLP MED LRG 5 ANG JAW (MISCELLANEOUS) ×2
BAG SPEC RTRVL 10 TROC 200 (ENDOMECHANICALS) ×2
CANISTER SUCTION 2500CC (MISCELLANEOUS) ×3 IMPLANT
CHLORAPREP W/TINT 26ML (MISCELLANEOUS) ×3 IMPLANT
CLIP APPLIE 5 13 M/L LIGAMAX5 (MISCELLANEOUS) ×2 IMPLANT
COVER MAYO STAND STRL (DRAPES) IMPLANT
COVER SURGICAL LIGHT HANDLE (MISCELLANEOUS) ×3 IMPLANT
DERMABOND ADVANCED (GAUZE/BANDAGES/DRESSINGS) ×1
DERMABOND ADVANCED .7 DNX12 (GAUZE/BANDAGES/DRESSINGS) ×2 IMPLANT
DEVICE TROCAR PUNCTURE CLOSURE (ENDOMECHANICALS) ×2 IMPLANT
DRAPE C-ARM 42X72 X-RAY (DRAPES) IMPLANT
DRAPE LAPAROSCOPIC ABDOMINAL (DRAPES) ×3 IMPLANT
ELECT REM PT RETURN 9FT ADLT (ELECTROSURGICAL) ×3
ELECTRODE REM PT RTRN 9FT ADLT (ELECTROSURGICAL) ×2 IMPLANT
GLOVE BIO SURGEON STRL SZ7 (GLOVE) ×3 IMPLANT
GLOVE BIO SURGEON STRL SZ8 (GLOVE) ×2 IMPLANT
GLOVE BIOGEL PI IND STRL 7.0 (GLOVE) ×2 IMPLANT
GLOVE BIOGEL PI IND STRL 7.5 (GLOVE) ×2 IMPLANT
GLOVE BIOGEL PI IND STRL 8 (GLOVE) ×1 IMPLANT
GLOVE BIOGEL PI INDICATOR 7.0 (GLOVE) ×2
GLOVE BIOGEL PI INDICATOR 7.5 (GLOVE) ×1
GLOVE BIOGEL PI INDICATOR 8 (GLOVE) ×1
GLOVE SURG SS PI 7.0 STRL IVOR (GLOVE) ×2 IMPLANT
GOWN STRL REUS W/ TWL LRG LVL3 (GOWN DISPOSABLE) ×6 IMPLANT
GOWN STRL REUS W/ TWL XL LVL3 (GOWN DISPOSABLE) ×1 IMPLANT
GOWN STRL REUS W/TWL LRG LVL3 (GOWN DISPOSABLE) ×6
GOWN STRL REUS W/TWL XL LVL3 (GOWN DISPOSABLE) ×3
KIT BASIN OR (CUSTOM PROCEDURE TRAY) ×3 IMPLANT
KIT ROOM TURNOVER OR (KITS) ×3 IMPLANT
NS IRRIG 1000ML POUR BTL (IV SOLUTION) ×3 IMPLANT
PAD ARMBOARD 7.5X6 YLW CONV (MISCELLANEOUS) ×3 IMPLANT
POUCH RETRIEVAL ECOSAC 10 (ENDOMECHANICALS) ×2 IMPLANT
POUCH RETRIEVAL ECOSAC 10MM (ENDOMECHANICALS) ×1
SCISSORS LAP 5X35 DISP (ENDOMECHANICALS) ×3 IMPLANT
SET CHOLANGIOGRAPH 5 50 .035 (SET/KITS/TRAYS/PACK) ×2 IMPLANT
SET IRRIG TUBING LAPAROSCOPIC (IRRIGATION / IRRIGATOR) ×3 IMPLANT
SLEEVE ENDOPATH XCEL 5M (ENDOMECHANICALS) ×6 IMPLANT
SPECIMEN JAR SMALL (MISCELLANEOUS) ×3 IMPLANT
SUT MNCRL AB 4-0 PS2 18 (SUTURE) ×3 IMPLANT
SUT VICRYL 0 UR6 27IN ABS (SUTURE) ×2 IMPLANT
TOWEL OR 17X24 6PK STRL BLUE (TOWEL DISPOSABLE) ×1 IMPLANT
TOWEL OR 17X26 10 PK STRL BLUE (TOWEL DISPOSABLE) ×3 IMPLANT
TRAY LAPAROSCOPIC (CUSTOM PROCEDURE TRAY) ×3 IMPLANT
TROCAR XCEL BLUNT TIP 100MML (ENDOMECHANICALS) ×3 IMPLANT
TROCAR XCEL NON-BLD 5MMX100MML (ENDOMECHANICALS) ×3 IMPLANT
TUBING INSUFFLATION (TUBING) ×3 IMPLANT

## 2014-03-01 NOTE — Discharge Instructions (Signed)
[facility] °LAPAROSCOPIC SURGERY: POST OP INSTRUCTIONS ° °Always review your discharge instruction sheet given to you by the facility where your surgery was performed. °IF YOU HAVE DISABILITY OR FAMILY LEAVE FORMS, YOU MUST BRING THEM TO THE OFFICE FOR PROCESSING.   °DO NOT GIVE THEM TO YOUR DOCTOR. ° °1. A prescription for pain medication may be given to you upon discharge.  Take your pain medication as prescribed, if needed.  If narcotic pain medicine is not needed, then you may take acetaminophen (Tylenol), naprosyn (Alleve), or ibuprofen (Advil) as needed. °2. Take your usually prescribed medications unless otherwise directed. °3. If you need a refill on your pain medication, please contact your pharmacy.  They will contact our office to request authorization. Prescriptions will not be filled after 5pm or on week-ends. °4. You should follow a light diet the first few days after arrival home, such as soup and crackers, etc.  Be sure to include lots of fluids daily. °5. Most patients will experience some swelling and bruising in the area of the incisions.  Ice packs will help.  Swelling and bruising can take several days to resolve.  °6. It is common to experience some constipation if taking pain medication after surgery.  Increasing fluid intake and taking a stool softener (such as Colace) will usually help or prevent this problem from occurring.  A mild laxative (Milk of Magnesia or Miralax) should be taken according to package instructions if there are no bowel movements after 48 hours. °7. Unless discharge instructions indicate otherwise, you may remove your bandages 48 hours after surgery, and you may shower at that time.  You may have steri-strips (small skin tapes) in place directly over the incision.  These strips should be left on the skin for 7-10 days.  If your surgeon used skin glue on the incision, you may shower in 24 hours.  The glue will flake  off over the next 2-3 weeks.  Any sutures or staples will be removed at the office during your follow-up visit. °8. ACTIVITIES:  You may resume regular (light) daily activities beginning the next day--such as daily self-care, walking, climbing stairs--gradually increasing activities as tolerated.  You may have sexual intercourse when it is comfortable.  Refrain from any heavy lifting or straining until approved by your doctor. °a. You may drive when you are no longer taking prescription pain medication, you can comfortably wear a seatbelt, and you can safely maneuver your car and apply brakes. °b. RETURN TO WORK:  __________________________________________________________ °9. You should see your doctor in the office for a follow-up appointment approximately 2-3 weeks after your surgery.  Make sure that you call for this appointment within a day or two after you arrive home to insure a convenient appointment time. °10. OTHER INSTRUCTIONS: __________________________________________________________________________________________________________________________ __________________________________________________________________________________________________________________________ °WHEN TO CALL YOUR DOCTOR: °1. Fever over 101.0 °2. Inability to urinate °3. Continued bleeding from incision. °4. Increased pain, redness, or drainage from the incision. °5. Increasing abdominal pain ° °The clinic staff is available to answer your questions during regular business hours.  Please don’t hesitate to call and ask to speak to one of the nurses for clinical concerns.  If you have a medical emergency, go to the nearest emergency room or call 911.  A surgeon from [facility] is always on call at the hospital. °[address], [address], [place], [place]  [address] ? [address] [address], [place], [place]   [address] °[phone] ? [phone] ? [phone]   [phone] °Web site: [email or web address] ° ° °What to  eat: ° °For your first meals, you should eat lightly; only small meals initially.  If you do not have nausea, you may eat larger meals.  Avoid spicy, greasy and heavy food.   ° °General Anesthesia, Adult, Care After  °Refer to this sheet in the next few weeks. These instructions provide you with information on caring for yourself after your procedure. Your health care provider may also give you more specific instructions. Your treatment has been planned according to current medical practices, but problems sometimes occur. Call your health care provider if you have any problems or questions after your procedure.  °WHAT TO EXPECT AFTER THE PROCEDURE  °After the procedure, it is typical to experience:  °Sleepiness.  °Nausea and vomiting. °HOME CARE INSTRUCTIONS  °For the first 24 hours after general anesthesia:  °Have a responsible person with you.  °Do not drive a car. If you are alone, do not take public transportation.  °Do not drink alcohol.  °Do not take medicine that has not been prescribed by your health care provider.  °Do not sign important papers or make important decisions.  °You may resume a normal diet and activities as directed by your health care provider.  °Change bandages (dressings) as directed.  °If you have questions or problems that seem related to general anesthesia, call the hospital and ask for the anesthetist or anesthesiologist on call. °SEEK MEDICAL CARE IF:  °You have nausea and vomiting that continue the day after anesthesia.  °You develop a rash. °SEEK IMMEDIATE MEDICAL CARE IF:  °You have difficulty breathing.  °You have chest pain.  °You have any allergic problems. °Document Released: 07/30/2000 Document Revised: 12/24/2012 Document Reviewed: 11/06/2012  °ExitCare® Patient Information ©2014 [employer].  ° ° °

## 2014-03-01 NOTE — H&P (Signed)
The patient is a 73 year old female who presents for evaluation of gall stones. 73 yo healthy female who presents after having epigastric pain and nausea since August. She was noted on labs at Dr Therisa DoynePerinis office to have elevated bilirubin. CT was done that showed cholelithiasis, and possible filling defect in the lower CBD. She then underwent ercp with Dr Christella HartiganJacobs that showed choledocholithias and purulent cholangitis. she has done well since then with resolution of all her preop symptoms. She thinks in retrospect she had scleral icterus. Never had a fever.    Other Problems Teresa Morris(Teresa McDowell, LPN; 28/41/324410/20/2015 1:31 PM) High blood pressure  Past Surgical History Teresa Morris(Teresa McDowell, LPN; 01/02/725310/20/2015 1:31 PM) Appendectomy Cataract Surgery Bilateral. Cesarean Section - 1 Foot Surgery Bilateral. Tonsillectomy  Diagnostic Studies History Teresa Morris(Teresa McDowell, LPN; 66/44/034710/20/2015 1:31 PM) Colonoscopy 5-10 years ago Mammogram within last year Pap Smear 1-5 years ago  Allergies Teresa Morris(Teresa McDowell, LPN; 42/59/563810/20/2015 1:33 PM) No Known Drug Allergies10/20/2015  Medication History Teresa Morris(Teresa McDowell, LPN; 75/64/332910/20/2015 1:33 PM) AmLODIPine Besylate (5MG  Tablet, Oral) Active. Benazepril HCl (20MG  Tablet, Oral) Active. Ciprofloxacin HCl (500MG  Tablet, Oral) Active. Multi Vitamin Daily (Oral) Active.  Social History Teresa Morris(Teresa McDowell, LPN; 51/88/416610/20/2015 1:31 PM) Caffeine use Carbonated beverages, Coffee, Tea. No alcohol use No drug use Tobacco use Former smoker.  Family History Teresa Morris(Teresa McDowell, LPN; 06/30/160110/20/2015 1:31 PM) Alcohol Abuse Mother. Arthritis Mother. Cervical Cancer Mother. Diabetes Mellitus Father. Thyroid problems Daughter, Sister, Son.  Pregnancy / Birth History Teresa Morris(Teresa McDowell, LPN; 09/32/355710/20/2015 1:31 PM) Age at menarche 10 years. Age of menopause 5446-50 Gravida 2 Maternal age 74-20 Para 2 Regular periods  Review of Systems Teresa Morris(Teresa McDowell LPN; 32/20/254210/20/2015 1:31 PM) General  Present- Weight Loss. Not Present- Appetite Loss, Chills, Fatigue, Fever, Night Sweats and Weight Gain. Skin Present- Jaundice. Not Present- Change in Wart/Mole, Dryness, Hives, New Lesions, Non-Healing Wounds, Rash and Ulcer. HEENT Not Present- Earache, Hearing Loss, Hoarseness, Nose Bleed, Oral Ulcers, Ringing in the Ears, Seasonal Allergies, Sinus Pain, Sore Throat, Visual Disturbances, Wears glasses/contact lenses and Yellow Eyes. Respiratory Not Present- Bloody sputum, Chronic Cough, Difficulty Breathing, Snoring and Wheezing. Breast Not Present- Breast Mass, Breast Pain, Nipple Discharge and Skin Changes. Cardiovascular Not Present- Chest Pain, Difficulty Breathing Lying Down, Leg Cramps, Palpitations, Rapid Heart Rate, Shortness of Breath and Swelling of Extremities. Gastrointestinal Present- Abdominal Pain, Nausea and Vomiting. Not Present- Bloating, Bloody Stool, Change in Bowel Habits, Chronic diarrhea, Constipation, Difficulty Swallowing, Excessive gas, Gets full quickly at meals, Hemorrhoids, Indigestion and Rectal Pain. Female Genitourinary Not Present- Frequency, Nocturia, Painful Urination, Pelvic Pain and Urgency. Musculoskeletal Not Present- Back Pain, Joint Pain, Joint Stiffness, Muscle Pain, Muscle Weakness and Swelling of Extremities. Neurological Not Present- Decreased Memory, Fainting, Headaches, Numbness, Seizures, Tingling, Tremor, Trouble walking and Weakness. Psychiatric Not Present- Anxiety, Bipolar, Change in Sleep Pattern, Depression, Fearful and Frequent crying. Endocrine Not Present- Cold Intolerance, Excessive Hunger, Hair Changes, Heat Intolerance, Hot flashes and New Diabetes. Hematology Not Present- Easy Bruising, Excessive bleeding, Gland problems, HIV and Persistent Infections.   Vitals Teresa Morris(Teresa McDowell LPN; 70/62/376210/20/2015 1:34 PM) 02/23/2014 1:34 PM Weight: 109.13 lb Height: 62in Body Surface Area: 1.47 m Body Mass Index: 19.96 kg/m Temp.:  98.52F(Temporal)  Pulse: 72 (Regular)  Resp.: 18 (Unlabored)  BP: 132/78 (Sitting, Left Arm, Standard)    Physical Exam Emelia Loron(Webster Patrone MD; 02/23/2014 1:56 PM) General Mental Status-Alert. Orientation-Oriented X3.  Eye Sclera/Conjunctiva - Left-No scleral icterus.  Chest and Lung Exam Chest and lung exam reveals -normal excursion with symmetric chest walls, quiet,  even and easy respiratory effort with no use of accessory muscles and on auscultation, normal breath sounds, no adventitious sounds and normal vocal resonance.  Cardiovascular Cardiovascular examination reveals -normal heart sounds, regular rate and rhythm with no murmurs.  Abdomen Inspection Inspection of the abdomen reveals - No Hernias. Palpation/Percussion Palpation and Percussion of the abdomen reveal - Soft and Non Tender. Auscultation Auscultation of the abdomen reveals - Bowel sounds normal.    Assessment & Plan Emelia Loron(Kevante Lunt MD; 02/23/2014 2:00 PM) CHOLEDOCHOLITHIASIS WITH OBSTRUCTION (574.51  K80.51) Impression: Lap chole with IOC  we discussed indication to prevent further epidoses and removing gallstones with gallbladder. I discussed the procedure in detail. We discussed the risks and benefits of a laparoscopic cholecystectomy and cholangiogram including, but not limited to bleeding, infection, injury to surrounding structures such as the intestine or liver, bile leak, retained gallstones, need to convert to an open procedure, prolonged diarrhea, blood clots such as DVT, common bile duct injury, anesthesia risks, and possible need for additional procedures. The likelihood of improvement in symptoms and return to the patient's normal status is good. We discussed the typical post-operative recovery course.

## 2014-03-01 NOTE — Progress Notes (Signed)
Dilaudid 0.75 mg wasted  witnessed by Lacie Scottseresa Citty RN

## 2014-03-01 NOTE — Interval H&P Note (Signed)
History and Physical Interval Note:  03/01/2014 9:28 AM  Teresa Morris  has presented today for surgery, with the diagnosis of cholelithiasis  The various methods of treatment have been discussed with the patient and family. After consideration of risks, benefits and other options for treatment, the patient has consented to  Procedure(s): LAPAROSCOPIC CHOLECYSTECTOMY WITH INTRAOPERATIVE CHOLANGIOGRAM (N/A) as a surgical intervention .  The patient's history has been reviewed, patient examined, no change in status, stable for surgery.  I have reviewed the patient's chart and labs.  Questions were answered to the patient's satisfaction.     [name]

## 2014-03-01 NOTE — Transfer of Care (Signed)
Immediate Anesthesia Transfer of Care Note  Patient: Teresa Morris  Procedure(s) Performed: Procedure(s): LAPAROSCOPIC CHOLECYSTECTOMY (N/A)  Patient Location: PACU  Anesthesia Type:General  Level of Consciousness: awake, alert  and oriented  Airway & Oxygen Therapy: Patient Spontanous Breathing and Patient connected to nasal cannula oxygen  Post-op Assessment: Report given to PACU RN and Post -op Vital signs reviewed and stable  Post vital signs: Reviewed and stable  Complications: No apparent anesthesia complications

## 2014-03-01 NOTE — Op Note (Signed)
Preoperative diagnosis: history choledocholithiasis, s/p ercp Postoperative diagnosis: same as above Procedure: laparoscopic cholecystectomy Surgeon: Dr Harden MoMatt Karigan Cloninger Anesthesia: general EBL: minimal Specimens: sponge and needle count correct times two Complications: none Drains: none Sponge count correct Disposition stable  Indications: This is a [age] yof who presents after undergoing ERCP for choledocholithiasis. She was then referred for evaluation for cholecystectomy.  We discussed risks and benefits prior to beginning.  Procedure: After informed consent was obtained the patient was taken to the operating room. Her preoperative lfts were normal.  Antibiotics were given.  SCDs were in place.  She was then placed under general anesthesia without complication.  She was then prepped and draped in the standard sterile surgical fashion. A surgical timeout was then performed.  I infiltrated marcaine below the umbilicus.  I then made a vertical incision.  I grasped the fascia.  I incised it and entered the peritoneum easily. There was no evidence of any entry injury and I looked again later. There were some adhesions in the rlq below me.  I then inserted three further trocars under direct vision after infiltration with local anesthetic in the epigastrium and ruq.  I then grasped the gallbladder and retracted it cephalad.  There were some adhesions to the duodenum that were taken down. The gallbladder was then retracted cephalad and lateral.  I then was able to identify the cystic duct and artery. I obtained the critical view of safety. I clipped the artery and divided it leaving two clips in place. I then clipped the duct distally.  I made a ductotomy. There were numerous small stones still in her duct consistent with her history. I was able to milk many of these out. I was not able to pass a catheter after multiple attempts.  I decided due to normal lfts preop and her sphincterotomy that I would abort  this.  The size of the stones that are still there should pass easily. I will recheck her lfts later this week to make sure.  I then clipped a viable duct three times and divided it. The clips completely traversed the duct.  I then removed the gallbladder from the liver bed without difficulty. This was placed in a bag and removed.  I then obtained hemostasis.  I then irrigated. I then removed my hasson trocar. I did place another 2 0 vicryl sutures using the endoclose device to completely close this.  I then desufflated the abdomen and removed the remaining trocars. These were closed with 4-0 monocryl and dermabond. She tolerated this well, was extubated and transferred to recovery stable.

## 2014-03-01 NOTE — Anesthesia Preprocedure Evaluation (Signed)
Anesthesia Evaluation  Patient identified by MRN, date of birth, ID band Patient awake    Reviewed: Allergy & Precautions, H&P , NPO status , Patient's Chart, lab work & pertinent test results  History of Anesthesia Complications (+) PONV  Airway Mallampati: II       Dental  (+) Teeth Intact   Pulmonary former smoker,  breath sounds clear to auscultation        Cardiovascular hypertension, + Valvular Problems/Murmurs Rhythm:Regular Rate:Normal + Systolic murmurs    Neuro/Psych    GI/Hepatic GERD-  ,  Endo/Other    Renal/GU      Musculoskeletal   Abdominal   Peds  Hematology   Anesthesia Other Findings   Reproductive/Obstetrics                             Anesthesia Physical Anesthesia Plan  ASA: II  Anesthesia Plan: General   Post-op Pain Management:    Induction: Intravenous  Airway Management Planned: Oral ETT  Additional Equipment:   Intra-op Plan:   Post-operative Plan: Extubation in OR  Informed Consent: I have reviewed the patients History and Physical, chart, labs and discussed the procedure including the risks, benefits and alternatives for the proposed anesthesia with the patient or authorized representative who has indicated his/her understanding and acceptance.   Dental advisory given  Plan Discussed with: CRNA and Surgeon  Anesthesia Plan Comments:         Anesthesia Quick Evaluation

## 2014-03-01 NOTE — Anesthesia Postprocedure Evaluation (Signed)
  Anesthesia Post-op Note  Patient: Teresa Morris  Procedure(s) Performed: Procedure(s): LAPAROSCOPIC CHOLECYSTECTOMY (N/A)  Patient Location: PACU  Anesthesia Type:General  Level of Consciousness: awake and alert   Airway and Oxygen Therapy: Patient Spontanous Breathing  Post-op Pain: mild  Post-op Assessment: Post-op Vital signs reviewed  Post-op Vital Signs: stable  Last Vitals:  Filed Vitals:   03/01/14 1245  BP:   Pulse: 82  Temp:   Resp: 18    Complications: No apparent anesthesia complications

## 2014-03-01 NOTE — Anesthesia Procedure Notes (Signed)
Procedure Name: Intubation Date/Time: 03/01/2014 10:05 AM Performed by: Gwenyth AllegraADAMI, Stefana Lodico Pre-anesthesia Checklist: Emergency Drugs available, Patient identified, Timeout performed, Suction available and Patient being monitored Patient Re-evaluated:Patient Re-evaluated prior to inductionOxygen Delivery Method: Circle system utilized Preoxygenation: Pre-oxygenation with 100% oxygen Intubation Type: IV induction Laryngoscope Size: Mac and 3 Grade View: Grade I Tube type: Oral Tube size: 7.0 mm Number of attempts: 1 Airway Equipment and Method: Stylet Placement Confirmation: ETT inserted through vocal cords under direct vision,  breath sounds checked- equal and bilateral and positive ETCO2 Secured at: 21 cm Tube secured with: Tape Dental Injury: Teeth and Oropharynx as per pre-operative assessment

## 2014-03-02 ENCOUNTER — Telehealth (INDEPENDENT_AMBULATORY_CARE_PROVIDER_SITE_OTHER): Payer: Self-pay

## 2014-03-02 ENCOUNTER — Encounter (HOSPITAL_COMMUNITY): Payer: Self-pay | Admitting: General Surgery

## 2014-03-02 DIAGNOSIS — Z9049 Acquired absence of other specified parts of digestive tract: Secondary | ICD-10-CM

## 2014-03-02 NOTE — Telephone Encounter (Signed)
Called pt to notify her that I have placed labs in epic for pt to get labs drawn on 03/04/14 per Dr Doreen SalvageWakefield's request to make sure pt is doing ok after having lap chole done. Pt understands and will go Thursday.

## 2014-06-17 ENCOUNTER — Other Ambulatory Visit: Payer: Self-pay | Admitting: Obstetrics and Gynecology

## 2014-06-22 LAB — CYTOLOGY - PAP

## 2014-07-29 ENCOUNTER — Encounter: Payer: Self-pay | Admitting: Internal Medicine

## 2014-09-07 ENCOUNTER — Encounter: Payer: Self-pay | Admitting: Internal Medicine

## 2014-10-27 ENCOUNTER — Ambulatory Visit (AMBULATORY_SURGERY_CENTER): Payer: Self-pay | Admitting: *Deleted

## 2014-10-27 VITALS — Ht 62.0 in | Wt 109.8 lb

## 2014-10-27 DIAGNOSIS — Z1211 Encounter for screening for malignant neoplasm of colon: Secondary | ICD-10-CM

## 2014-10-27 NOTE — Progress Notes (Signed)
No egg or soy allergy  No anesthesia or intubation problems per pt  No diet medications taken  Registered in EMMI   

## 2014-11-01 ENCOUNTER — Other Ambulatory Visit: Payer: Self-pay

## 2014-11-10 ENCOUNTER — Encounter: Payer: Self-pay | Admitting: Internal Medicine

## 2014-11-10 ENCOUNTER — Ambulatory Visit (AMBULATORY_SURGERY_CENTER): Payer: Medicare Other | Admitting: Internal Medicine

## 2014-11-10 VITALS — BP 140/72 | HR 70 | Temp 97.9°F | Resp 16 | Ht 62.0 in | Wt 109.0 lb

## 2014-11-10 DIAGNOSIS — Z1211 Encounter for screening for malignant neoplasm of colon: Secondary | ICD-10-CM | POA: Diagnosis not present

## 2014-11-10 MED ORDER — SODIUM CHLORIDE 0.9 % IV SOLN: 500.0000 mL | INTRAVENOUS | Status: DC

## 2014-11-10 NOTE — Progress Notes (Signed)
Transferred to recovery room. A/O x3, pleased with MAC.  VSS.  Report to Karen, RN. 

## 2014-11-10 NOTE — Patient Instructions (Signed)
YOU HAD AN ENDOSCOPIC PROCEDURE TODAY AT THE [facility]:   Refer to the procedure report that was given to you for any specific questions about what was found during the examination.  If the procedure report does not answer your questions, please call your gastroenterologist to clarify.  If you requested that your care partner not be given the details of your procedure findings, then the procedure report has been included in a sealed envelope for you to review at your convenience later.  YOU SHOULD EXPECT: Some feelings of bloating in the abdomen. Passage of more gas than usual.  Walking can help get rid of the air that was put into your GI tract during the procedure and reduce the bloating. If you had a lower endoscopy (such as a colonoscopy or flexible sigmoidoscopy) you may notice spotting of blood in your stool or on the toilet paper. If you underwent a bowel prep for your procedure, you may not have a normal bowel movement for a few days.  Please Note:  You might notice some irritation and congestion in your nose or some drainage.  This is from the oxygen used during your procedure.  There is no need for concern and it should clear up in a day or so.  SYMPTOMS TO REPORT IMMEDIATELY:   Following lower endoscopy (colonoscopy or flexible sigmoidoscopy):  Excessive amounts of blood in the stool  Significant tenderness or worsening of abdominal pains  Swelling of the abdomen that is new, acute  Fever of 100F or higher   For urgent or emergent issues, a gastroenterologist can be reached at any hour by calling [phone].   DIET: Your first meal following the procedure should be a small meal and then it is ok to progress to your normal diet. Heavy or fried foods are harder to digest and may make you feel nauseous or bloated.  Likewise, meals heavy in dairy and vegetables can increase bloating.  Drink plenty of fluids but you should avoid alcoholic beverages for 24  hours.  ACTIVITY:  You should plan to take it easy for the rest of today and you should NOT DRIVE or use heavy machinery until tomorrow (because of the sedation medicines used during the test).    FOLLOW UP: Our staff will call the number listed on your records the next business day following your procedure to check on you and address any questions or concerns that you may have regarding the information given to you following your procedure. If we do not reach you, we will leave a message.  However, if you are feeling well and you are not experiencing any problems, there is no need to return our call.  We will assume that you have returned to your regular daily activities without incident.  If any biopsies were taken you will be contacted by phone or by letter within the next 1-3 weeks.  Please call us at [phone] if you have not heard about the biopsies in 3 weeks.    SIGNATURES/CONFIDENTIALITY: You and/or your care partner have signed paperwork which will be entered into your electronic medical record.  These signatures attest to the fact that that the information above on your After Visit Summary has been reviewed and is understood.  Full responsibility of the confidentiality of this discharge information lies with you and/or your care-partner. 

## 2014-11-10 NOTE — Op Note (Signed)
[facility] [address] [place], [id number]   COLONOSCOPY PROCEDURE REPORT  PATIENT: Morris, Teresa ChafeJosephine M  MR#: [id number] BIRTHDATE: [birth year] , 74  yrs. old GENDER: female ENDOSCOPIST: Hart Carwinora M Zineb Glade, MD REFERRED XB:JYNWBY:Mark Perini, M.D. PROCEDURE DATE:  11/10/2014 PROCEDURE:   Colonoscopy, screening First Screening Colonoscopy - Avg.  risk and is 50 yrs.  old or older - No.  Prior Negative Screening - Now for repeat screening. 10 or more years since last screening  History of Adenoma - Now for follow-up colonoscopy & has been > or = to 3 yrs.  N/A  Polyps removed today? No Recommend repeat exam, <10 yrs? No ASA CLASS:   Class II INDICATIONS:Screening for colonic neoplasia and Colorectal Neoplasm Risk Assessment for this procedure is average risk. MEDICATIONS: Monitored anesthesia care and Propofol 200 mg IV  DESCRIPTION OF PROCEDURE:   After the risks benefits and alternatives of the procedure were thoroughly explained, informed consent was obtained.  The digital rectal exam revealed no abnormalities of the rectum.   The LB [id number]-H190 [id number]  endoscope was introduced through the anus and advanced to the cecum, which was identified by both the appendix and ileocecal valve. No adverse events experienced.   The quality of the prep was good.  (MoviPrep was used)  The instrument was then slowly withdrawn as the colon was fully examined. Estimated blood loss is zero unless otherwise noted in this procedure report.      COLON FINDINGS: There was moderate diverticulosis noted throughout the entire examined colon with associated muscular hypertrophy, luminal narrowing, angulation and tortuosity.  Retroflexed views revealed no abnormalities. The time to cecum = 14.10 Withdrawal time = 7.12   The scope was withdrawn and the procedure completed. COMPLICATIONS: There were no immediate complications.  ENDOSCOPIC IMPRESSION: There was moderate diverticulosis noted  throughout the entire examined colon  RECOMMENDATIONS: High fiber diet No recall due to age  eSigned:  Hart Carwinora M Mayleigh Tetrault, MD 11/10/2014 10:29 AM   cc:

## 2014-11-11 ENCOUNTER — Telehealth: Payer: Self-pay

## 2014-11-11 NOTE — Telephone Encounter (Signed)
  Follow up Call-  Call back number 11/10/2014  Post procedure Call Back phone  # [phone]  Permission to leave phone message Yes     Patient questions:  Do you have a fever, pain , or abdominal swelling? No. Pain Score  0 *  Have you tolerated food without any problems? Yes.    Have you been able to return to your normal activities? No.  Do you have any questions about your discharge instructions: Diet   No. Medications  No. Follow up visit  No.  Do you have questions or concerns about your Care? No.  Actions: * If pain score is 4 or above: No action needed, pain <4.

## 2016-01-26 ENCOUNTER — Encounter: Payer: Self-pay | Admitting: Internal Medicine

## 2016-04-22 IMAGING — RF DG ERCP WO/W SPHINCTEROTOMY
1 series · 6 of 6 positions shown · non-contrast
Comparison: 01/26/2014

CLINICAL DATA: Choledocholithiasis

EXAM:
ERCP with balloon sweep for stone removal
TECHNIQUE: Multiple spot images obtained with the fluoroscopic device and
submitted for interpretation post-procedure.

[Series 1: run · 6 of 6 slices shown]
[im 1/6]
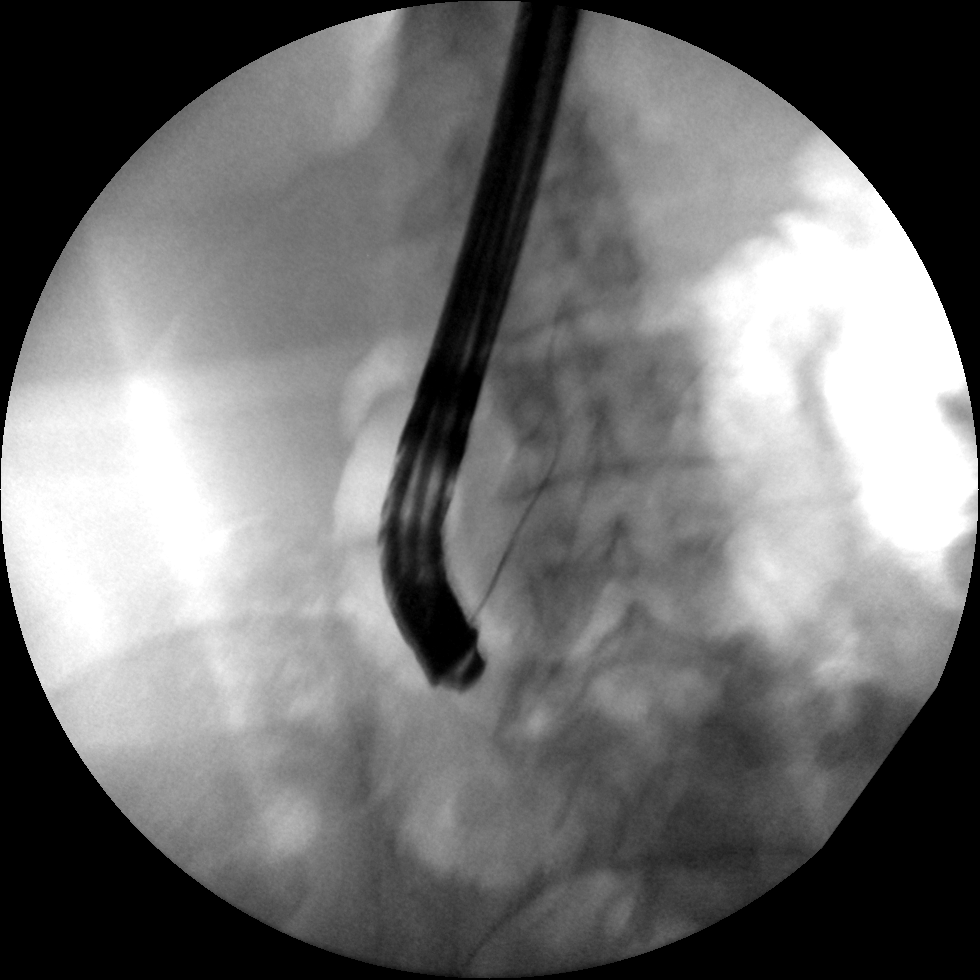
[im 2/6]
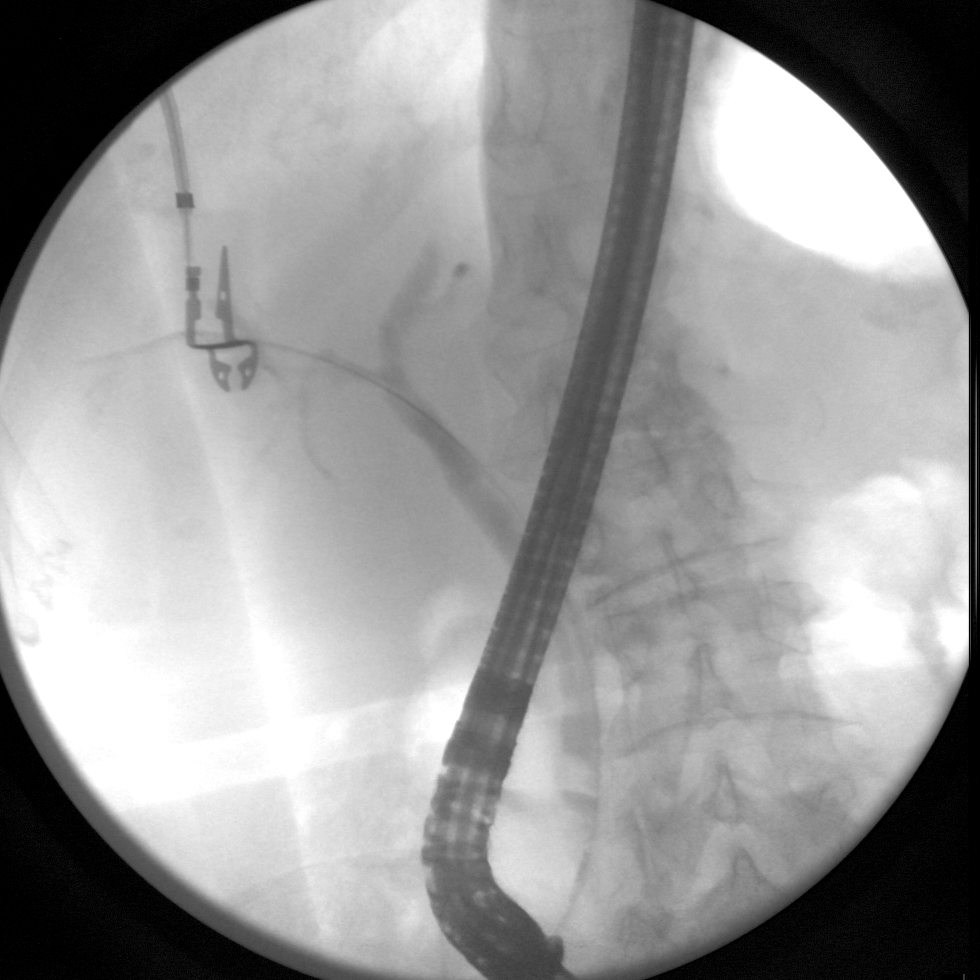
[im 3/6]
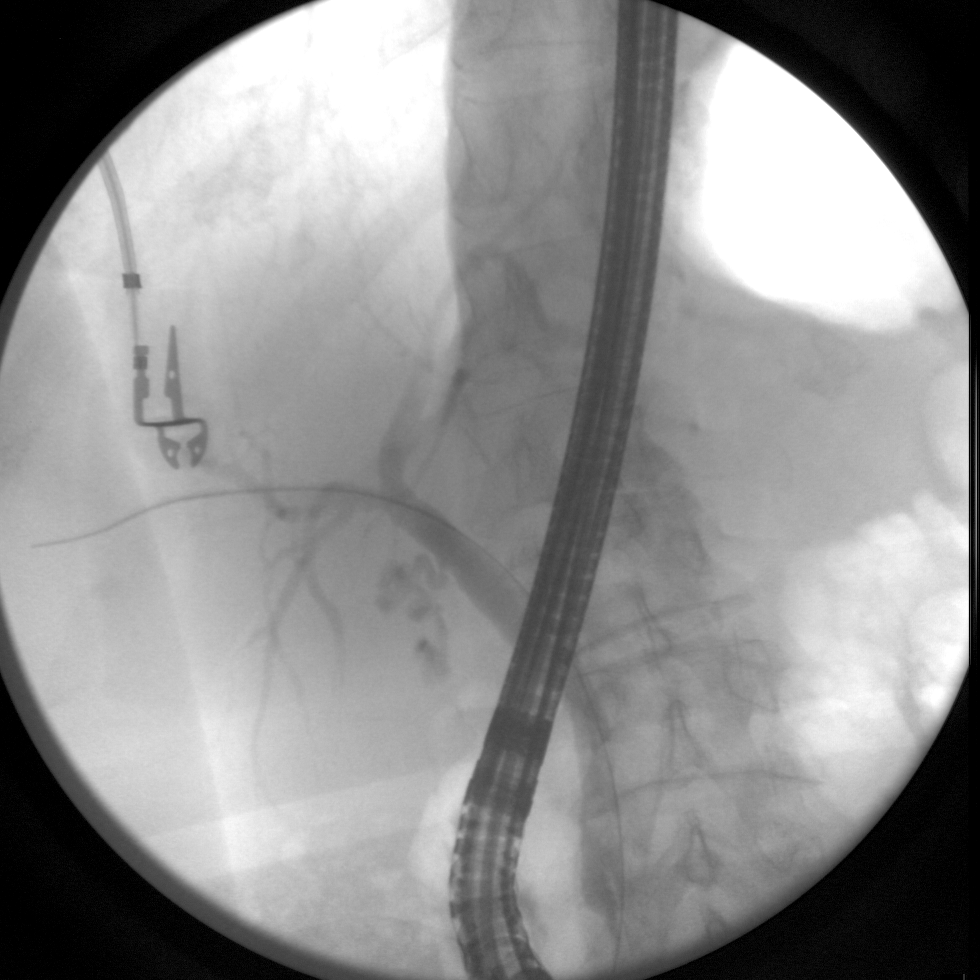
[im 4/6]
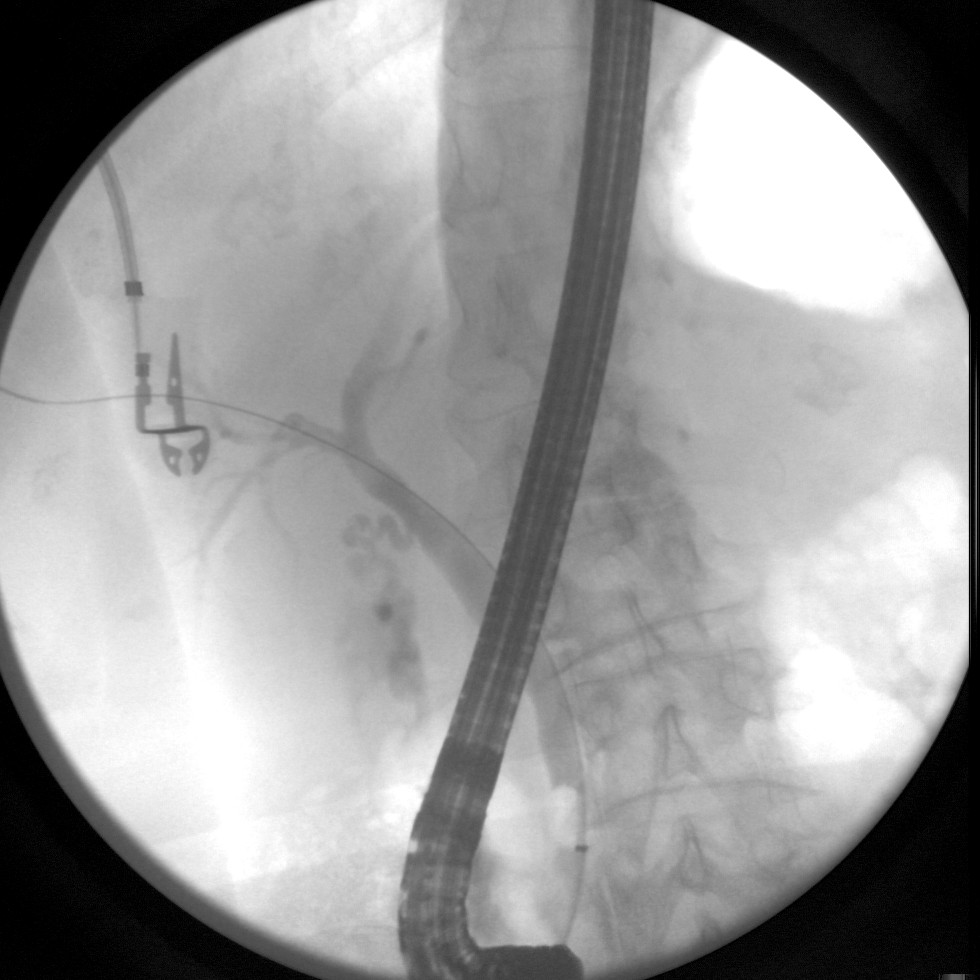
[im 5/6]
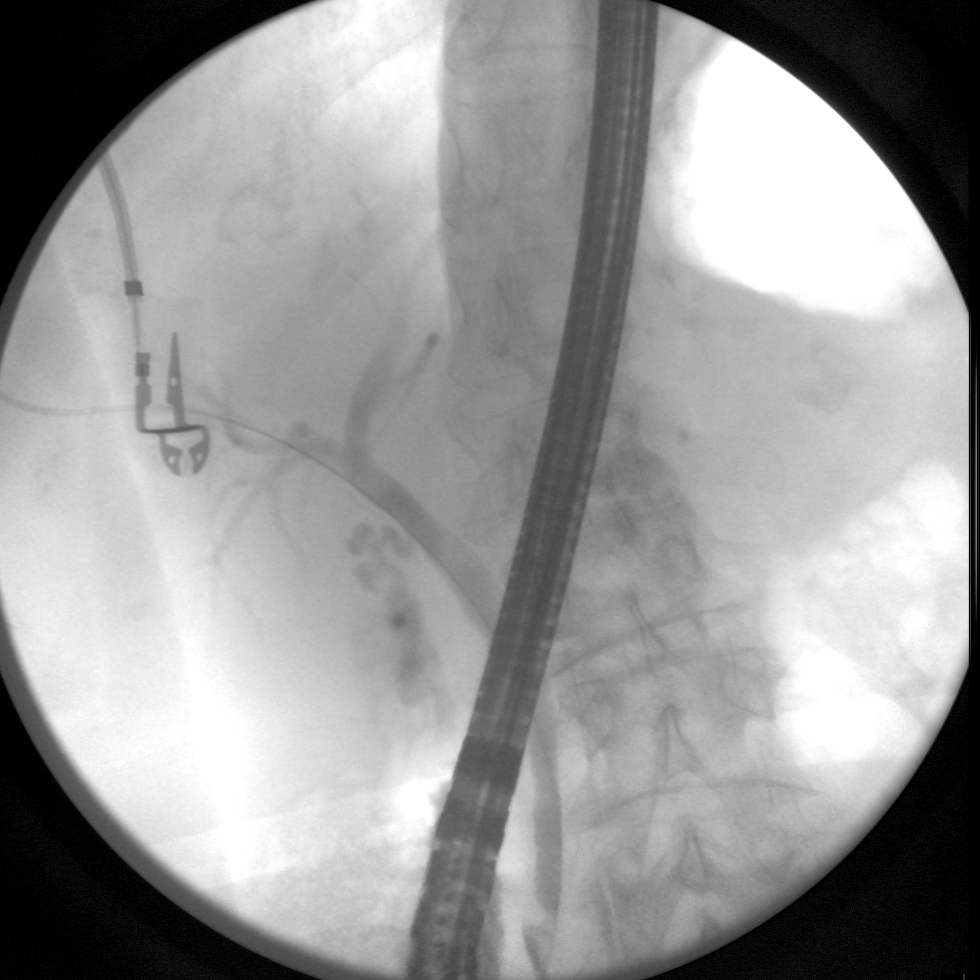
[im 6/6]
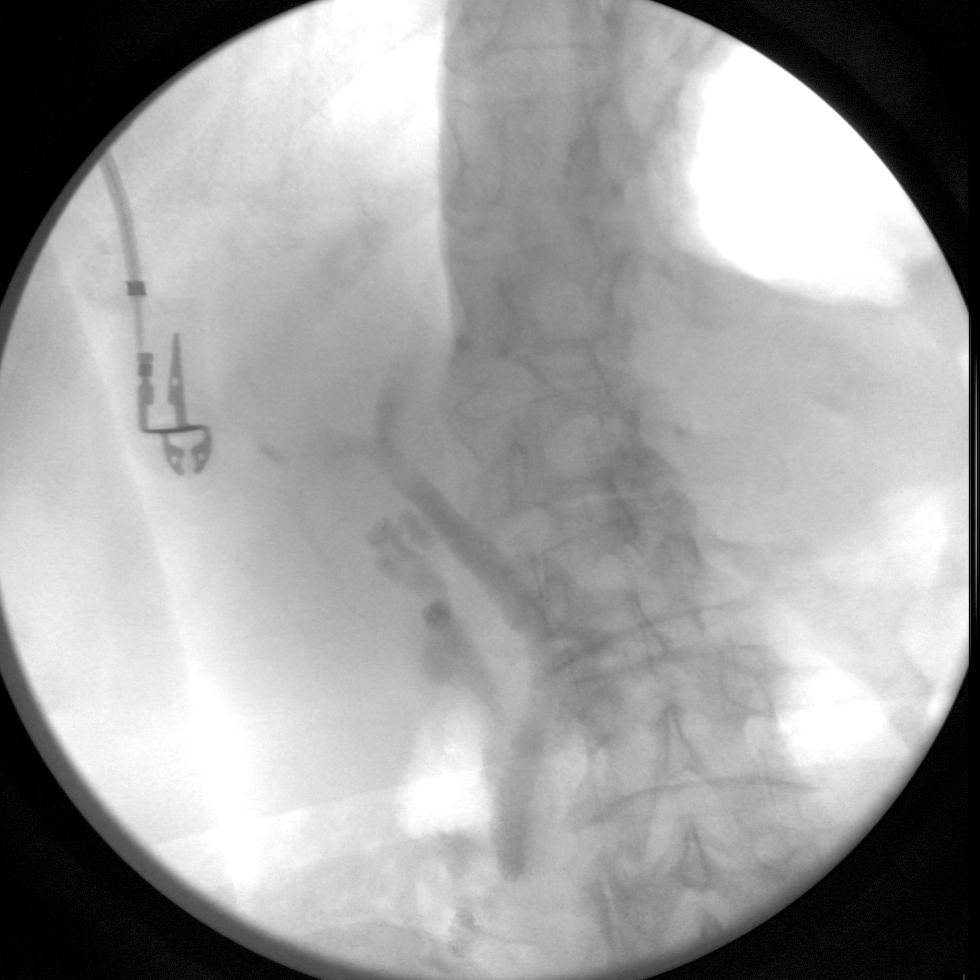

[6 of 6 positions shown; findings below may reference images not displayed]

FINDINGS: Spot fluoroscopic views during ERCP demonstrate a guidewire and
catheter access within the bile duct. Contrast injection does
confirm the distal CBD filling defect. Balloon sweep performed for
stone removal. Negative for obstruction.
IMPRESSION: Limited imaging during ERCP with balloon sweep and sphincterotomy
for CBD stone removal.

These images were submitted for radiologic interpretation only.
Please see the procedural report for the amount of contrast and the
fluoroscopy time utilized.

## 2017-03-04 ENCOUNTER — Other Ambulatory Visit: Payer: Self-pay | Admitting: Internal Medicine

## 2017-03-04 DIAGNOSIS — R011 Cardiac murmur, unspecified: Secondary | ICD-10-CM

## 2017-03-11 ENCOUNTER — Ambulatory Visit (HOSPITAL_COMMUNITY): Payer: Medicare Other | Attending: Cardiology

## 2017-03-11 ENCOUNTER — Other Ambulatory Visit: Payer: Self-pay

## 2017-03-11 DIAGNOSIS — I358 Other nonrheumatic aortic valve disorders: Secondary | ICD-10-CM | POA: Diagnosis not present

## 2017-03-11 DIAGNOSIS — R011 Cardiac murmur, unspecified: Secondary | ICD-10-CM | POA: Diagnosis present

## 2017-03-11 DIAGNOSIS — Z87891 Personal history of nicotine dependence: Secondary | ICD-10-CM | POA: Insufficient documentation

## 2017-03-11 DIAGNOSIS — I10 Essential (primary) hypertension: Secondary | ICD-10-CM | POA: Diagnosis not present

## 2017-03-11 DIAGNOSIS — Z8249 Family history of ischemic heart disease and other diseases of the circulatory system: Secondary | ICD-10-CM | POA: Insufficient documentation

## 2017-03-11 DIAGNOSIS — E785 Hyperlipidemia, unspecified: Secondary | ICD-10-CM | POA: Insufficient documentation

## 2021-01-25 ENCOUNTER — Other Ambulatory Visit: Payer: Self-pay

## 2021-01-25 ENCOUNTER — Emergency Department (HOSPITAL_COMMUNITY)
Admission: EM | Admit: 2021-01-25 | Discharge: 2021-01-26 | Disposition: A | Discharge status: Home / Self Care | Payer: Medicare Other | Attending: Emergency Medicine | Admitting: Emergency Medicine

## 2021-01-25 ENCOUNTER — Encounter (HOSPITAL_COMMUNITY): Payer: Self-pay

## 2021-01-25 DIAGNOSIS — S0990XA Unspecified injury of head, initial encounter: Secondary | ICD-10-CM | POA: Diagnosis not present

## 2021-01-25 DIAGNOSIS — Y9222 Religious institution as the place of occurrence of the external cause: Secondary | ICD-10-CM | POA: Diagnosis not present

## 2021-01-25 DIAGNOSIS — S0592XA Unspecified injury of left eye and orbit, initial encounter: Secondary | ICD-10-CM | POA: Diagnosis not present

## 2021-01-25 DIAGNOSIS — H0520 Unspecified exophthalmos: Secondary | ICD-10-CM | POA: Insufficient documentation

## 2021-01-25 DIAGNOSIS — E0500 Thyrotoxicosis with diffuse goiter without thyrotoxic crisis or storm: Secondary | ICD-10-CM | POA: Diagnosis not present

## 2021-01-25 DIAGNOSIS — W19XXXA Unspecified fall, initial encounter: Secondary | ICD-10-CM | POA: Insufficient documentation

## 2021-01-25 DIAGNOSIS — W01198A Fall on same level from slipping, tripping and stumbling with subsequent striking against other object, initial encounter: Secondary | ICD-10-CM | POA: Insufficient documentation

## 2021-01-25 DIAGNOSIS — I10 Essential (primary) hypertension: Secondary | ICD-10-CM | POA: Diagnosis not present

## 2021-01-25 NOTE — ED Triage Notes (Signed)
Pt states that she fell at church around 2000. Pt hit her left eye and busted the left side of her lip.

## 2021-01-26 ENCOUNTER — Encounter (HOSPITAL_BASED_OUTPATIENT_CLINIC_OR_DEPARTMENT_OTHER): Payer: Self-pay | Admitting: *Deleted

## 2021-01-26 ENCOUNTER — Emergency Department (HOSPITAL_BASED_OUTPATIENT_CLINIC_OR_DEPARTMENT_OTHER): Payer: Medicare Other

## 2021-01-26 ENCOUNTER — Emergency Department (HOSPITAL_BASED_OUTPATIENT_CLINIC_OR_DEPARTMENT_OTHER)
Admission: EM | Admit: 2021-01-26 | Discharge: 2021-01-26 | Disposition: A | Discharge status: Home / Self Care | Payer: Medicare Other | Source: Home / Self Care | Attending: Emergency Medicine | Admitting: Emergency Medicine

## 2021-01-26 DIAGNOSIS — W1839XA Other fall on same level, initial encounter: Secondary | ICD-10-CM | POA: Insufficient documentation

## 2021-01-26 DIAGNOSIS — R55 Syncope and collapse: Secondary | ICD-10-CM | POA: Insufficient documentation

## 2021-01-26 DIAGNOSIS — I10 Essential (primary) hypertension: Secondary | ICD-10-CM | POA: Insufficient documentation

## 2021-01-26 DIAGNOSIS — Z79899 Other long term (current) drug therapy: Secondary | ICD-10-CM | POA: Insufficient documentation

## 2021-01-26 DIAGNOSIS — S01112A Laceration without foreign body of left eyelid and periocular area, initial encounter: Secondary | ICD-10-CM | POA: Insufficient documentation

## 2021-01-26 DIAGNOSIS — Y9222 Religious institution as the place of occurrence of the external cause: Secondary | ICD-10-CM | POA: Insufficient documentation

## 2021-01-26 DIAGNOSIS — S0990XA Unspecified injury of head, initial encounter: Secondary | ICD-10-CM

## 2021-01-26 DIAGNOSIS — Z87891 Personal history of nicotine dependence: Secondary | ICD-10-CM | POA: Insufficient documentation

## 2021-01-26 DIAGNOSIS — Y9301 Activity, walking, marching and hiking: Secondary | ICD-10-CM | POA: Insufficient documentation

## 2021-01-26 LAB — CBC WITH DIFFERENTIAL/PLATELET
Abs Immature Granulocytes: 0.03 10*3/uL (ref 0.00–0.07)
Basophils Absolute: 0 10*3/uL (ref 0.0–0.1)
Basophils Relative: 0 %
Eosinophils Absolute: 0.1 10*3/uL (ref 0.0–0.5)
Eosinophils Relative: 1 %
HCT: 41.9 % (ref 36.0–46.0)
Hemoglobin: 13.6 g/dL (ref 12.0–15.0)
Immature Granulocytes: 0 %
Lymphocytes Relative: 15 %
Lymphs Abs: 1.5 10*3/uL (ref 0.7–4.0)
MCH: 28.9 pg (ref 26.0–34.0)
MCHC: 32.5 g/dL (ref 30.0–36.0)
MCV: 89.1 fL (ref 80.0–100.0)
Monocytes Absolute: 0.8 10*3/uL (ref 0.1–1.0)
Monocytes Relative: 9 %
Neutro Abs: 7.1 10*3/uL (ref 1.7–7.7)
Neutrophils Relative %: 75 %
Platelets: 292 10*3/uL (ref 150–400)
RBC: 4.7 MIL/uL (ref 3.87–5.11)
RDW: 12.7 % (ref 11.5–15.5)
WBC: 9.5 10*3/uL (ref 4.0–10.5)
nRBC: 0 % (ref 0.0–0.2)

## 2021-01-26 LAB — COMPREHENSIVE METABOLIC PANEL
ALT: 15 U/L (ref 0–44)
AST: 19 U/L (ref 15–41)
Albumin: 4.5 g/dL (ref 3.5–5.0)
Alkaline Phosphatase: 59 U/L (ref 38–126)
Anion gap: 10 (ref 5–15)
BUN: 18 mg/dL (ref 8–23)
CO2: 28 mmol/L (ref 22–32)
Calcium: 9.8 mg/dL (ref 8.9–10.3)
Chloride: 103 mmol/L (ref 98–111)
Creatinine, Ser: 0.82 mg/dL (ref 0.44–1.00)
GFR, Estimated: >60 mL/min (ref >60)
Glucose, Bld: 96 mg/dL (ref 70–99)
Potassium: 4 mmol/L (ref 3.5–5.1)
Sodium: 141 mmol/L (ref 135–145)
Total Bilirubin: 0.7 mg/dL (ref 0.3–1.2)
Total Protein: 6.9 g/dL (ref 6.5–8.1)

## 2021-01-26 NOTE — Discharge Instructions (Addendum)
As we discussed your work-up was very reassuring today.  I do not see any obvious causes of your fall yesterday it is possible that you tripped and your gap in memory is secondary to a minor concussion.  Please continue to monitor yourself for changes including severe headache that does not improve with medication, issues with balance, dizziness, facial droop, weakness, or further episodes of loss of consciousness.  Please follow-up with your primary care as needed.  It was a pleasure take care of you today I hope you have a great rest of your day.

## 2021-01-26 NOTE — ED Provider Notes (Signed)
[facility] EMERGENCY DEPT Provider Note   CSN: [id number] Arrival date & time: 01/26/21  1050     History Chief Complaint  Patient presents with   Fall   Head Injury   Loss of Consciousness    Teresa Morris is a 80 y.o. female with past medical history significant for osteopenia, hypertension who presents after having a fall last night at church.  Patient reports that she was walking out of church last night with a friend, when she took a fall to the ground.  Patient denies any prodrome, denies racing heart, or palpitations, denies feeling lightheaded, however she does not remember the fall, does not remember waking up, does not remember the drive home.  Patient reports that she had minimal nausea after the event without vomiting.  Patient has had no photophobia, no ongoing headache, no vision changes.  Notably patient has Graves' disease, and has some exophthalmos of both eyes, as well as a right lid droop that were both present prior to the fall.  Patient reports only symptom this morning is some tenderness to her chin.  Reports she feels that her teeth fit together as normal. Patient is not on a blood thinner.   Fall Pertinent negatives include no headaches.  Head Injury Associated symptoms: no headaches   Loss of Consciousness Associated symptoms: no dizziness, no headaches and no weakness       Past Medical History:  Diagnosis Date   Cholelithiasis    Diverticulosis    Fasting hyperglycemia 2011    FBS 112   GERD (gastroesophageal reflux disease)    Heart murmur    Hypertension    Eclampsia 1958   Osteopenia 2004   T score -2.2, Dr Vickey Sages, Gyn   Pneumothorax 1962   spontaneous   PONV (postoperative nausea and vomiting)    Seizure disorder during pregnancy [facility])     Patient Active Problem List   Diagnosis Date Noted   Nonspecific (abnormal) findings on radiological and other examination of biliary tract 01/29/2014   Choledocholithiasis  01/29/2014   DIVERTICULOSIS, COLON 08/19/2009   LUMBAR RADICULOPATHY, LEFT 08/19/2009   CERVICAL RADICULOPATHY 04/02/2008   HYPERLIPIDEMIA 08/21/2007   HYPERTENSION 09/04/2006   OSTEOPENIA 09/04/2006    Past Surgical History:  Procedure Laterality Date   CATARACT EXTRACTION, BILATERAL  2008 & 2010   Dr Dagoberto Ligas   CESAREAN SECTION     X 2   CHEST TUBE INSERTION  1962   Dr Blinda Leatherwood   CHOLECYSTECTOMY N/A 03/01/2014   Procedure: LAPAROSCOPIC CHOLECYSTECTOMY;  Surgeon: Emelia Loron, MD;  Location: [facility] OR;  Service: General;  Laterality: N/A;   COLONOSCOPY  2001   Negative   COLONOSCOPY  2006   Tics   ERCP N/A 01/29/2014   Procedure: ENDOSCOPIC RETROGRADE CHOLANGIOPANCREATOGRAPHY (ERCP);  Surgeon: Rachael Fee, MD;  Location: [facility] ENDOSCOPY;  Service: Gastroenterology;  Laterality: N/A;   FOOT SURGERY       OB History   No obstetric history on file.     Family History  Problem Relation Age of Onset   Bladder Cancer Mother    Diabetes Mother    Uterine cancer Mother    Diabetes Father    Hypertension Maternal Grandmother    Heart attack Maternal Grandmother 76   Thyroid disease Son        hypothyroid   Thyroid disease Sister        hyperactive; surgery   Thyroid disease Daughter        hyperactive;  RAI   Stroke Neg Hx    Colon cancer Neg Hx    Colon polyps Neg Hx    Esophageal cancer Neg Hx    Rectal cancer Neg Hx    Stomach cancer Neg Hx     Social History   Tobacco Use   Smoking status: Former    Types: Cigarettes    Quit date: 05/07/1960    Years since quitting: 60.7   Smokeless tobacco: Never   Tobacco comments:    Smoked 5 years total  1957-1962, up to 1/2 ppd; quit 1962 post pneumothorax  Substance Use Topics   Alcohol use: No   Drug use: No    Home Medications Prior to Admission medications   Medication Sig Start Date End Date Taking? Authorizing Provider  amLODipine (NORVASC) 5 MG tablet Take 5 mg by mouth daily.   Yes [provider]  atorvastatin (LIPITOR) 10 MG tablet Take 10 mg by mouth See admin instructions. Takes 4 tablets weekly   Yes [provider]  benazepril (LOTENSIN) 20 MG tablet Take 20 mg by mouth daily.   Yes [provider]  Biotin 2500 MCG CAPS Take 5,000 mcg by mouth daily.   Yes [provider]  Calcium Citrate-Vitamin D (CITRACAL + D PO) Take 1 tablet by mouth daily.   Yes [provider]  cholecalciferol (VITAMIN D3) 25 MCG (1000 UNIT) tablet Take 50 mcg by mouth daily.   Yes [provider]  methimazole (TAPAZOLE) 5 MG tablet Take 5 mg by mouth daily.   Yes [provider]  Multiple Vitamin (MULTIVITAMIN) tablet Take 1 tablet by mouth daily.     Yes [provider]    Allergies    Patient has no known allergies.  Review of Systems   Review of Systems  Cardiovascular:  Positive for syncope.  Neurological:  Positive for syncope. Negative for dizziness, tremors, speech difficulty, weakness, light-headedness and headaches.  All other systems reviewed and are negative.  Physical Exam Updated Vital Signs BP 129/78 (BP Location: Left Arm)   Pulse 81   Temp 97.8 F (36.6 C) (Oral)   Resp (!) 23   Ht 5' 2.5" (1.588 m)   Wt 46.7 kg   SpO2 100%   BMI 18.54 kg/m   Physical Exam Vitals and nursing note reviewed.  Constitutional:      General: She is not in acute distress.    Appearance: Normal appearance.  HENT:     Head: Normocephalic.      Comments: Large well dark demarcated bruise versus hematoma on the bottom left of the patient's chin. No excoriation.    Mouth/Throat:     Comments: Medium sized bruise without excoriation of the left lateral corner of the mouth, with some swelling especially of the lower lip.  No evidence of damage to the teeth.  No evidence of injury inside the mouth.  Tongue is midline, uvula is midline.  Patient with intact swallow reflex Eyes:     General:        Right eye: No discharge.         Left eye: No discharge.      Comments: Significant drooping of the right eyelid such that at rest the covers the top third of her pupil.  Bilateral exophthalmos.  Subconjunctival hemorrhage that does not pass into the iris on the medial aspect of the left eye.  Extraocular movements intact.  Pupils are equal round and reactive to light.  Bruising along  the left outer edge of the eye and eyebrow.  Without any obvious step-off or deformity.  Small well approximated linear laceration to outer edge of the eyelid.  Not actively bleeding.  Cardiovascular:     Rate and Rhythm: Normal rate and regular rhythm.     Heart sounds: No murmur heard.   No friction rub. No gallop.  Pulmonary:     Effort: Pulmonary effort is normal.     Breath sounds: Normal breath sounds.  Abdominal:     General: Bowel sounds are normal.     Palpations: Abdomen is soft.  Skin:    General: Skin is warm and dry.     Capillary Refill: Capillary refill takes less than 2 seconds.  Neurological:     Mental Status: She is alert and oriented to person, place, and time.     Comments: Cranial nerves III through XII grossly intact.  Patient with 5 out of 5 strength in bilateral upper and lower extremities.  Romberg negative.  Gait normal.  Intact heel-to-shin.  Patient alert and oriented, answers questions quickly and easily.  Psychiatric:        Mood and Affect: Mood normal.        Behavior: Behavior normal.    ED Results / Procedures / Treatments   Labs (all labs ordered are listed, but only abnormal results are displayed) Labs Reviewed  CBC WITH DIFFERENTIAL/PLATELET  COMPREHENSIVE METABOLIC PANEL    EKG None  Radiology CT HEAD WO CONTRAST ( )  Result Date: 01/26/2021 CLINICAL DATA:  Syncope last night, fall, bruising around right eye and cheek EXAM: CT HEAD WITHOUT CONTRAST CT MAXILLOFACIAL WITHOUT CONTRAST CT CERVICAL SPINE WITHOUT CONTRAST TECHNIQUE: Multidetector CT imaging of the head, cervical spine, and  maxillofacial structures were performed using the standard protocol without intravenous contrast. Multiplanar CT image reconstructions of the cervical spine and maxillofacial structures were also generated. COMPARISON:  None. FINDINGS: CT HEAD FINDINGS Brain: No evidence of acute infarction, hemorrhage, hydrocephalus, extra-axial collection or mass lesion/mass effect. Mild periventricular deep white matter hypodensity. Vascular: No hyperdense vessel or unexpected calcification. CT FACIAL BONES FINDINGS Skull: Normal. Negative for fracture or focal lesion. Facial bones: No displaced fractures or dislocations. Sinuses/Orbits: No acute finding. Other: Soft tissue contusion about the right forehead and left cheek (series 7, image 45, 69). CT CERVICAL SPINE FINDINGS Alignment: Normal. Skull base and vertebrae: No acute fracture. No primary bone lesion or focal pathologic process. Soft tissues and spinal canal: No prevertebral fluid or swelling. No visible canal hematoma. Disc levels:  Mild disc space height loss and osteophytosis. Upper chest: Negative. Other: None. IMPRESSION: 1. No acute intracranial pathology. Small-vessel white matter disease. 2. No displaced fracture or dislocation of the facial bones. 3. Soft tissue contusion about the right forehead and left cheek. 4. No fracture or static subluxation of the cervical spine. Electronically Signed   By: Lauralyn Primes M.D.   On: 01/26/2021 12:32   CT Cervical Spine Wo Contrast  Result Date: 01/26/2021 CLINICAL DATA:  Syncope last night, fall, bruising around right eye and cheek EXAM: CT HEAD WITHOUT CONTRAST CT MAXILLOFACIAL WITHOUT CONTRAST CT CERVICAL SPINE WITHOUT CONTRAST TECHNIQUE: Multidetector CT imaging of the head, cervical spine, and maxillofacial structures were performed using the standard protocol without intravenous contrast. Multiplanar CT image reconstructions of the cervical spine and maxillofacial structures were also generated. COMPARISON:   None. FINDINGS: CT HEAD FINDINGS Brain: No evidence of acute infarction, hemorrhage, hydrocephalus, extra-axial collection or mass lesion/mass effect. Mild periventricular deep  white matter hypodensity. Vascular: No hyperdense vessel or unexpected calcification. CT FACIAL BONES FINDINGS Skull: Normal. Negative for fracture or focal lesion. Facial bones: No displaced fractures or dislocations. Sinuses/Orbits: No acute finding. Other: Soft tissue contusion about the right forehead and left cheek (series 7, image 45, 69). CT CERVICAL SPINE FINDINGS Alignment: Normal. Skull base and vertebrae: No acute fracture. No primary bone lesion or focal pathologic process. Soft tissues and spinal canal: No prevertebral fluid or swelling. No visible canal hematoma. Disc levels:  Mild disc space height loss and osteophytosis. Upper chest: Negative. Other: None. IMPRESSION: 1. No acute intracranial pathology. Small-vessel white matter disease. 2. No displaced fracture or dislocation of the facial bones. 3. Soft tissue contusion about the right forehead and left cheek. 4. No fracture or static subluxation of the cervical spine. Electronically Signed   By: Lauralyn Primes M.D.   On: 01/26/2021 12:32   CT Maxillofacial Wo Contrast  Result Date: 01/26/2021 CLINICAL DATA:  Syncope last night, fall, bruising around right eye and cheek EXAM: CT HEAD WITHOUT CONTRAST CT MAXILLOFACIAL WITHOUT CONTRAST CT CERVICAL SPINE WITHOUT CONTRAST TECHNIQUE: Multidetector CT imaging of the head, cervical spine, and maxillofacial structures were performed using the standard protocol without intravenous contrast. Multiplanar CT image reconstructions of the cervical spine and maxillofacial structures were also generated. COMPARISON:  None. FINDINGS: CT HEAD FINDINGS Brain: No evidence of acute infarction, hemorrhage, hydrocephalus, extra-axial collection or mass lesion/mass effect. Mild periventricular deep white matter hypodensity. Vascular: No  hyperdense vessel or unexpected calcification. CT FACIAL BONES FINDINGS Skull: Normal. Negative for fracture or focal lesion. Facial bones: No displaced fractures or dislocations. Sinuses/Orbits: No acute finding. Other: Soft tissue contusion about the right forehead and left cheek (series 7, image 45, 69). CT CERVICAL SPINE FINDINGS Alignment: Normal. Skull base and vertebrae: No acute fracture. No primary bone lesion or focal pathologic process. Soft tissues and spinal canal: No prevertebral fluid or swelling. No visible canal hematoma. Disc levels:  Mild disc space height loss and osteophytosis. Upper chest: Negative. Other: None. IMPRESSION: 1. No acute intracranial pathology. Small-vessel white matter disease. 2. No displaced fracture or dislocation of the facial bones. 3. Soft tissue contusion about the right forehead and left cheek. 4. No fracture or static subluxation of the cervical spine. Electronically Signed   By: Lauralyn Primes M.D.   On: 01/26/2021 12:32    Procedures Procedures   Medications Ordered in ED Medications - No data to display  ED Course  I have reviewed the triage vital signs and the nursing notes.  Pertinent labs & imaging results that were available during my care of the patient were reviewed by me and considered in my medical decision making (see chart for details).    MDM Rules/Calculators/A&P                         I discussed this case with my attending physician who cosigned this note including patient's presenting symptoms, physical exam, and planned diagnostics and interventions. Attending physician stated agreement with plan or made changes to plan which were implemented.   Unclear based on patient's recollection whether mechanical fall versus syncope.  Patient with some amnesia of the event consistent with a small concussion.  Initial radiographic finding does not reveal any intracranial abnormality, any deformity of the spine, any deformity of the facial  bones.  Will obtain EKG, and basic lab work to rule out cardiac or metabolic causes for sudden syncope and collapse.  EKG is significant for few premature beats, however on physical exam patient is in a regular rhythm for the majority of the time.  Other EKG changes are consistent with previous EKG. lab work was reassuring, no abnormalities to CBC or CMP.  I do not ascertain if etiologic reason why patient would be having syncopal episodes at this time.  It is possible she had a mechanical fall, her gap in memory is secondary to concussion symptoms.  Patient is asymptomatic at time of evaluation, complains of some pain in her chin.  Patient has no evidence of an intracranial abnormality, no significant reason to suspect an occult reason for her syncope.  Patient's history is not consistent with recent syncopal episodes.  Is able to ambulate without difficulty.  Patient appears stable and ready for discharge at this time.  Return precautions given. Final Clinical Impression(s) / ED Diagnoses Final diagnoses:  Injury of head, initial encounter    Rx / DC Orders ED Discharge Orders     None        Olene Floss, PA-C 01/26/21 1454    Franne Forts, DO 01/29/21 1150

## 2021-01-26 NOTE — ED Triage Notes (Signed)
Pt had a syncopal episode last night at church falling on [place]. Does not remember falling, reported pt was not responding when gentleman got to her. They picked her up and took her home, family took her to [facility] last night but was not seen, called PCP who sent her here this morning. Bruising around rt eye.and cheek. Denies N/V or headache.

## 2023-12-05 ENCOUNTER — Other Ambulatory Visit (HOSPITAL_COMMUNITY): Payer: Self-pay | Admitting: Internal Medicine

## 2023-12-05 DIAGNOSIS — R011 Cardiac murmur, unspecified: Secondary | ICD-10-CM

## 2024-01-15 ENCOUNTER — Ambulatory Visit (HOSPITAL_COMMUNITY)
Admission: RE | Admit: 2024-01-15 | Discharge: 2024-01-15 | Disposition: A | Discharge status: Home / Self Care | Payer: Self-pay | Source: Ambulatory Visit | Attending: Internal Medicine | Admitting: Internal Medicine

## 2024-01-15 DIAGNOSIS — R011 Cardiac murmur, unspecified: Secondary | ICD-10-CM | POA: Diagnosis present

## 2024-01-15 LAB — ECHOCARDIOGRAM COMPLETE
Area-P 1/2: 3.37 cm2
S' Lateral: 2.2 cm
